# Patient Record
Sex: Female | Born: 1975 | Race: White | Hispanic: No | Marital: Married | Smoking: Never smoker
Health system: Southern US, Community
[De-identification: ages and names within clinical notes are randomized; demographics above are authoritative.]

## PROBLEM LIST (undated history)

## (undated) DIAGNOSIS — I10 Essential (primary) hypertension: Secondary | ICD-10-CM

## (undated) DIAGNOSIS — R519 Headache, unspecified: Secondary | ICD-10-CM

## (undated) DIAGNOSIS — F32A Depression, unspecified: Secondary | ICD-10-CM

## (undated) DIAGNOSIS — J069 Acute upper respiratory infection, unspecified: Secondary | ICD-10-CM

## (undated) DIAGNOSIS — L309 Dermatitis, unspecified: Secondary | ICD-10-CM

## (undated) DIAGNOSIS — T7840XA Allergy, unspecified, initial encounter: Secondary | ICD-10-CM

## (undated) HISTORY — DX: Allergy, unspecified, initial encounter: T78.40XA

## (undated) HISTORY — DX: Depression, unspecified: F32.A

## (undated) HISTORY — PX: CHOLECYSTECTOMY: SHX55

## (undated) HISTORY — PX: GALLBLADDER SURGERY: SHX652

## (undated) HISTORY — PX: EYE SURGERY: SHX253

## (undated) HISTORY — PX: ABDOMINAL HYSTERECTOMY: SHX81

## (undated) HISTORY — DX: Headache, unspecified: R51.9

## (undated) HISTORY — PX: FRACTURE SURGERY: SHX138

## (undated) HISTORY — DX: Acute upper respiratory infection, unspecified: J06.9

## (undated) HISTORY — DX: Dermatitis, unspecified: L30.9

## (undated) HISTORY — DX: Essential (primary) hypertension: I10

---

## 2017-08-04 ENCOUNTER — Ambulatory Visit (INDEPENDENT_AMBULATORY_CARE_PROVIDER_SITE_OTHER): Payer: 59 | Admitting: Allergy and Immunology

## 2017-08-04 ENCOUNTER — Encounter: Payer: Self-pay | Admitting: Allergy and Immunology

## 2017-08-04 VITALS — BP 118/70 | HR 88 | Temp 98.7°F | Resp 16 | Ht 66.5 in | Wt 278.2 lb

## 2017-08-04 DIAGNOSIS — J3089 Other allergic rhinitis: Secondary | ICD-10-CM

## 2017-08-04 DIAGNOSIS — H1010 Acute atopic conjunctivitis, unspecified eye: Secondary | ICD-10-CM | POA: Insufficient documentation

## 2017-08-04 DIAGNOSIS — H1013 Acute atopic conjunctivitis, bilateral: Secondary | ICD-10-CM | POA: Diagnosis not present

## 2017-08-04 DIAGNOSIS — L309 Dermatitis, unspecified: Secondary | ICD-10-CM

## 2017-08-04 MED ORDER — EPINEPHRINE 0.3 MG/0.3ML IJ SOAJ: 0.3000 mg | Freq: Once | INTRAMUSCULAR | 1 refills | Status: AC

## 2017-08-04 MED ORDER — TRIAMCINOLONE ACETONIDE 0.1 % EX CREA
1.0000 | TOPICAL_CREAM | Freq: Two times a day (BID) | CUTANEOUS | 5 refills | Status: DC | PRN
Start: 2017-08-04 — End: 2022-04-17

## 2017-08-04 MED ORDER — OLOPATADINE HCL 0.2 % OP SOLN: 1.0000 [drp] | Freq: Every day | OPHTHALMIC | 5 refills | Status: DC | PRN

## 2017-08-04 NOTE — Patient Instructions (Addendum)
Perennial and seasonal allergic rhinitis  Aeroallergen avoidance measures have been discussed and provided in written form.  She will restart aero allergen immunotherapy injections.  To ensure comprehensive vials, we will combine today's results with her previous test results.  Continue levocetirizine 5 mg daily as needed.  To avoid diminishing benefit with daily use (tachyphylaxis) of second generation antihistamine, consider alternating every few months between fexofenadine (Allegra) and levocetirizine (Xyzal).  Continue Dymista, 2 sprays per nostril daily as needed.  I have also recommended nasal saline spray (i.e., Simply Saline) or nasal saline lavage (i.e., NeilMed) as needed and prior to medicated nasal sprays.  Allergic conjunctivitis  Treatment plan as outlined above for allergic rhinitis.  A prescription has been provided for Pataday, one drop per eye daily as needed.  Dermatitis Unclear etiology. This does not appear to be urticaria, contact dermatitis, or atopic dermatitis. Food allergen skin tests were negative today despite a positive histamine control.  A prescription has been provided for triamcinolone 0.1% cream sparingly to affected areas twice daily as needed.  If symptoms persist or progress, dermatology evaluation with biopsy of an active lesion is recommended.   Return for immunotherapy injections.  Reducing Pollen Exposure  The American Academy of Allergy, Asthma and Immunology suggests the following steps to reduce your exposure to pollen during allergy seasons.    1. Do not hang sheets or clothing out to dry; pollen may collect on these items. 2. Do not mow lawns or spend time around freshly cut grass; mowing stirs up pollen. 3. Keep windows closed at night.  Keep car windows closed while driving. 4. Minimize morning activities outdoors, a time when pollen counts are usually at their highest. 5. Stay indoors as much as possible when pollen counts or  humidity is high and on windy days when pollen tends to remain in the air longer. 6. Use air conditioning when possible.  Many air conditioners have filters that trap the pollen spores. 7. Use a HEPA room air filter to remove pollen form the indoor air you breathe.   Control of Mold Allergen  Mold and fungi can grow on a variety of surfaces provided certain temperature and moisture conditions exist.  Outdoor molds grow on plants, decaying vegetation and soil.  The major outdoor mold, Alternaria and Cladosporium, are found in very high numbers during hot and dry conditions.  Generally, a late Summer - Fall peak is seen for common outdoor fungal spores.  Rain will temporarily lower outdoor mold spore count, but counts rise rapidly when the rainy period ends.  The most important indoor molds are Aspergillus and Penicillium.  Dark, humid and poorly ventilated basements are ideal sites for mold growth.  The next most common sites of mold growth are the bathroom and the kitchen.  Outdoor MicrosoftMold Control 1. Use air conditioning and keep windows closed 2. Avoid exposure to decaying vegetation. 3. Avoid leaf raking. 4. Avoid grain handling. 5. Consider wearing a face mask if working in moldy areas.  Indoor Mold Control 1. Maintain humidity below 50%. 2. Clean washable surfaces with 5% bleach solution. 3. Remove sources e.g. Contaminated carpets.  Control of Dog or Cat Allergen  Avoidance is the best way to manage a dog or cat allergy. If you have a dog or cat and are allergic to dog or cats, consider removing the dog or cat from the home. If you have a dog or cat but don't want to find it a new home, or if your family wants  a pet even though someone in the household is allergic, here are some strategies that may help keep symptoms at bay:  1. Keep the pet out of your bedroom and restrict it to only a few rooms. Be advised that keeping the dog or cat in only one room will not limit the allergens to that  room. 2. Don't pet, hug or kiss the dog or cat; if you do, wash your hands with soap and water. 3. High-efficiency particulate air (HEPA) cleaners run continuously in a bedroom or living room can reduce allergen levels over time. 4. Place electrostatic material sheet in the air inlet vent in the bedroom. 5. Regular use of a high-efficiency vacuum cleaner or a central vacuum can reduce allergen levels. 6. Giving your dog or cat a bath at least once a week can reduce airborne allergen.  Control of House Dust Mite Allergen  House dust mites play a major role in allergic asthma and rhinitis.  They occur in environments with high humidity wherever human skin, the food for dust mites is found. High levels have been detected in dust obtained from mattresses, pillows, carpets, upholstered furniture, bed covers, clothes and soft toys.  The principal allergen of the house dust mite is found in its feces.  A gram of dust may contain 1,000 mites and 250,000 fecal particles.  Mite antigen is easily measured in the air during house cleaning activities.    1. Encase mattresses, including the box spring, and pillow, in an air tight cover.  Seal the zipper end of the encased mattresses with wide adhesive tape. 2. Wash the bedding in water of 130 degrees Farenheit weekly.  Avoid cotton comforters/quilts and flannel bedding: the most ideal bed covering is the dacron comforter. 3. Remove all upholstered furniture from the bedroom. 4. Remove carpets, carpet padding, rugs, and non-washable window drapes from the bedroom.  Wash drapes weekly or use plastic window coverings. 5. Remove all non-washable stuffed toys from the bedroom.  Wash stuffed toys weekly. 6. Have the room cleaned frequently with a vacuum cleaner and a damp dust-mop.  The patient should not be in a room which is being cleaned and should wait 1 hour after cleaning before going into the room. 7. Close and seal all heating outlets in the bedroom.   Otherwise, the room will become filled with dust-laden air.  An electric heater can be used to heat the room. 8. Reduce indoor humidity to less than 50%.  Do not use a humidifier.

## 2017-08-04 NOTE — Assessment & Plan Note (Signed)
   Treatment plan as outlined above for allergic rhinitis.  A prescription has been provided for Pataday, one drop per eye daily as needed. 

## 2017-08-04 NOTE — Progress Notes (Signed)
New Patient Note  RE: Kimberly Valentine MRN: [id number] DOB: 31-Aug-1976 Date of Office Visit: 08/04/2017  Referring provider: Tracey HarriesGuse, Lauren M, FNP Primary care provider: Tracey HarriesGuse, Lauren M, FNP  Chief Complaint: Allergic Rhinitis  and Rash   History of present illness: Kimberly Valentine is a 41 y.o. female seen today in consultation requested by Leanora CoverLauren Guse, FNP.  [place].  She was started on aeroallergen immunotherapy in May 2014 because she was tested and found to be "allergic to everything", including grass pollen, tree pollen, molds, dog epithelia, and dust.  She had experienced nasal congestion, sinus pressure over the forehead and cheekbones, postnasal drainage, ocular pruritus, and occasional rhinorrhea and sneezing.  She reports that her nasal, sinus, and ocular symptoms have improved over the past 4 years on immunotherapy, however she still requires levocetirizine and Dymista daily for symptom control.  She discontinued both these medications 3 days ago in anticipation of today's visit and has experienced increased symptoms, particularly generalized pruritus.  She has no history of asthma.  She has a history of hand and foot dermatitis which she attempts to treat with triamcinolone ointment.  She also reports that since May she has had very small red bumps on the tops of her feet when she is wearing shoes that expose the tops of her feet to the environment. The rash will appear without sunlight exposure. The rash is not pruritic and completely resolves within 12-24 hours.  There are no hives and no vesicles.   Assessment and plan: Perennial and seasonal allergic rhinitis  Aeroallergen avoidance measures have been discussed and provided in written form.  She will restart aero allergen immunotherapy injections.  To ensure comprehensive vials, we will combine today's results with her previous test results.  Continue  levocetirizine 5 mg daily as needed.  To avoid diminishing benefit with daily use (tachyphylaxis) of second generation antihistamine, consider alternating every few months between fexofenadine (Allegra) and levocetirizine (Xyzal).  Continue Dymista, 2 sprays per nostril daily as needed.  I have also recommended nasal saline spray (i.e., Simply Saline) or nasal saline lavage (i.e., NeilMed) as needed and prior to medicated nasal sprays.  Allergic conjunctivitis  Treatment plan as outlined above for allergic rhinitis.  A prescription has been provided for Pataday, one drop per eye daily as needed.  Dermatitis Unclear etiology. This does not appear to be urticaria, contact dermatitis, or atopic dermatitis. Food allergen skin tests were negative today despite a positive histamine control.  A prescription has been provided for triamcinolone 0.1% cream sparingly to affected areas twice daily as needed.  If symptoms persist or progress, dermatology evaluation with biopsy of an active lesion is recommended.   Meds ordered this encounter  Medications  . triamcinolone cream (KENALOG) 0.1 %    Sig: Apply 1 application 2 (two) times daily as needed topically.    Dispense:  60 g    Refill:  5  . Olopatadine HCl (PATADAY) 0.2 % SOLN    Sig: Place 1 drop daily as needed into both eyes.    Dispense:  1 Bottle    Refill:  5  . EPINEPHrine (AUVI-Q) 0.3 mg/0.3 mL IJ SOAJ injection    Sig: Inject 0.3 mLs (0.3 mg total) once for 1 dose into the muscle.    Dispense:  2 Device    Refill:  1    [id number]    Diagnostics: Environmental skin testing: Positive  to grass pollen molds, dog epithelia, and dust mite antigen. Food allergen skin testing: Negative despite a positive histamine control.    Physical examination: Blood pressure 118/70, pulse 88, temperature 98.7 F (37.1 C), temperature source Oral, resp. rate 16, height 5' 6.5" (1.689 m), weight 278 lb 3.2 oz (126.2 kg), SpO2 97  %.  General: Alert, interactive, in no acute distress. HEENT: TMs pearly gray, turbinates moderately edematous with clear discharge, post-pharynx mildly erythematous. Neck: Supple without lymphadenopathy. Lungs: Clear to auscultation without wheezing, rhonchi or rales. CV: Normal S1, S2 without murmurs. Abdomen: Nondistended, nontender. Skin: non-raised erythematous maucules. Extremities:  No clubbing, cyanosis or edema on dorsa of feet bilaterally. Neuro:   Grossly intact.  Review of systems:  Review of systems negative except as noted in HPI / PMHx or noted below: Review of Systems  Constitutional: Negative.   HENT: Negative.   Eyes: Negative.   Respiratory: Negative.   Cardiovascular: Negative.   Gastrointestinal: Negative.   Genitourinary: Negative.   Musculoskeletal: Negative.   Skin: Negative.   Neurological: Negative.   Endo/Heme/Allergies: Negative.   Psychiatric/Behavioral: Negative.     Past medical history:  Past Medical History:  Diagnosis Date  . Eczema    hands and feet  . Recurrent upper respiratory infection (URI)     Past surgical history:  Past Surgical History:  Procedure Laterality Date  . ABDOMINAL HYSTERECTOMY    . GALLBLADDER SURGERY      Family history: History reviewed. No pertinent family history.  Social history: Social History   Socioeconomic History  . Marital status: Married    Spouse name: Not on file  . Number of children: Not on file  . Years of education: Not on file  . Highest education level: Not on file  Social Needs  . Financial resource strain: Not on file  . Food insecurity - worry: Not on file  . Food insecurity - inability: Not on file  . Transportation needs - medical: Not on file  . Transportation needs - non-medical: Not on file  Occupational History  . Not on file  Tobacco Use  . Smoking status: Never Smoker  . Smokeless tobacco: Never Used  Substance and Sexual Activity  . Alcohol use: No    Frequency:  Never  . Drug use: No  . Sexual activity: Not on file  Other Topics Concern  . Not on file  Social History Narrative  . Not on file   Environmental History: The patient lives in a 31-year-old house with carpeting throughout and central air/heat.  3 dogs in the home which do not have access to her bedroom.  She is a non-smoker.  There is no known mold/water damage in the home.  Allergies as of 08/04/2017      Reactions   Septra [sulfamethoxazole-trimethoprim] Hives, Shortness Of Breath      Medication List        Accurate as of 08/04/17  5:43 PM. Always use your most recent med list.          DYMISTA 137-50 MCG/ACT Susp Generic drug:  Azelastine-Fluticasone Place into the nose.   EPINEPHrine 0.3 mg/0.3 mL Soaj injection Commonly known as:  AUVI-Q Inject 0.3 mLs (0.3 mg total) once for 1 dose into the muscle.   escitalopram 10 MG tablet Commonly known as:  LEXAPRO   levocetirizine 5 MG tablet Commonly known as:  XYZAL Take 5 mg every evening by mouth.   losartan-hydrochlorothiazide 100-12.5 MG tablet Commonly known as:  HYZAAR  Olopatadine HCl 0.2 % Soln Commonly known as:  PATADAY Place 1 drop daily as needed into both eyes.   triamcinolone cream 0.1 % Commonly known as:  KENALOG Apply 1 application 2 (two) times daily as needed topically.       Known medication allergies: Allergies  Allergen Reactions  . Septra [Sulfamethoxazole-Trimethoprim] Hives and Shortness Of Breath    I appreciate the opportunity to take part in Kismet's care. Please do not hesitate to contact me with questions.  Sincerely,   R. Jorene Guestarter Fallyn Munnerlyn, MD

## 2017-08-04 NOTE — Assessment & Plan Note (Signed)
Unclear etiology. This does not appear to be urticaria, contact dermatitis, or atopic dermatitis. Food allergen skin tests were negative today despite a positive histamine control.  A prescription has been provided for triamcinolone 0.1% cream sparingly to affected areas twice daily as needed.  If symptoms persist or progress, dermatology evaluation with biopsy of an active lesion is recommended.

## 2017-08-04 NOTE — Assessment & Plan Note (Signed)
   Aeroallergen avoidance measures have been discussed and provided in written form.  She will restart aero allergen immunotherapy injections.  To ensure comprehensive vials, we will combine today's results with her previous test results.  Continue levocetirizine 5 mg daily as needed.  To avoid diminishing benefit with daily use (tachyphylaxis) of second generation antihistamine, consider alternating every few months between fexofenadine (Allegra) and levocetirizine (Xyzal).  Continue Dymista, 2 sprays per nostril daily as needed.  I have also recommended nasal saline spray (i.e., Simply Saline) or nasal saline lavage (i.e., NeilMed) as needed and prior to medicated nasal sprays.

## 2017-08-11 ENCOUNTER — Telehealth: Payer: Self-pay

## 2017-08-11 NOTE — Telephone Encounter (Signed)
Spoke with pt and informed her to call aspn

## 2017-08-11 NOTE — Telephone Encounter (Signed)
LM for pt to call us back about calling aspn about shipment of auvi-q

## 2019-01-31 LAB — HM COLONOSCOPY

## 2020-05-03 DIAGNOSIS — Z1231 Encounter for screening mammogram for malignant neoplasm of breast: Secondary | ICD-10-CM | POA: Diagnosis not present

## 2020-09-02 DIAGNOSIS — Z20828 Contact with and (suspected) exposure to other viral communicable diseases: Secondary | ICD-10-CM | POA: Diagnosis not present

## 2020-09-02 DIAGNOSIS — J040 Acute laryngitis: Secondary | ICD-10-CM | POA: Diagnosis not present

## 2020-09-20 DIAGNOSIS — F32A Depression, unspecified: Secondary | ICD-10-CM | POA: Diagnosis not present

## 2020-09-20 DIAGNOSIS — F419 Anxiety disorder, unspecified: Secondary | ICD-10-CM | POA: Diagnosis not present

## 2020-09-20 DIAGNOSIS — I10 Essential (primary) hypertension: Secondary | ICD-10-CM | POA: Diagnosis not present

## 2021-03-21 DIAGNOSIS — F32A Depression, unspecified: Secondary | ICD-10-CM | POA: Diagnosis not present

## 2021-03-21 DIAGNOSIS — R5383 Other fatigue: Secondary | ICD-10-CM | POA: Diagnosis not present

## 2021-03-21 DIAGNOSIS — Z0000 Encounter for general adult medical examination without abnormal findings: Secondary | ICD-10-CM | POA: Diagnosis not present

## 2021-03-21 DIAGNOSIS — I10 Essential (primary) hypertension: Secondary | ICD-10-CM | POA: Diagnosis not present

## 2021-03-21 DIAGNOSIS — Z0001 Encounter for general adult medical examination with abnormal findings: Secondary | ICD-10-CM | POA: Diagnosis not present

## 2021-03-21 DIAGNOSIS — Z79899 Other long term (current) drug therapy: Secondary | ICD-10-CM | POA: Diagnosis not present

## 2021-03-21 DIAGNOSIS — Z760 Encounter for issue of repeat prescription: Secondary | ICD-10-CM | POA: Diagnosis not present

## 2021-03-21 DIAGNOSIS — F419 Anxiety disorder, unspecified: Secondary | ICD-10-CM | POA: Diagnosis not present

## 2021-04-23 DIAGNOSIS — R7989 Other specified abnormal findings of blood chemistry: Secondary | ICD-10-CM | POA: Diagnosis not present

## 2021-05-07 DIAGNOSIS — U071 COVID-19: Secondary | ICD-10-CM | POA: Diagnosis not present

## 2021-05-31 DIAGNOSIS — Z1231 Encounter for screening mammogram for malignant neoplasm of breast: Secondary | ICD-10-CM | POA: Diagnosis not present

## 2021-06-19 DIAGNOSIS — R7989 Other specified abnormal findings of blood chemistry: Secondary | ICD-10-CM | POA: Diagnosis not present

## 2021-08-13 DIAGNOSIS — J069 Acute upper respiratory infection, unspecified: Secondary | ICD-10-CM | POA: Diagnosis not present

## 2021-09-02 DIAGNOSIS — R051 Acute cough: Secondary | ICD-10-CM | POA: Diagnosis not present

## 2021-09-02 DIAGNOSIS — J019 Acute sinusitis, unspecified: Secondary | ICD-10-CM | POA: Diagnosis not present

## 2021-10-15 DIAGNOSIS — R7309 Other abnormal glucose: Secondary | ICD-10-CM | POA: Diagnosis not present

## 2021-10-15 DIAGNOSIS — K219 Gastro-esophageal reflux disease without esophagitis: Secondary | ICD-10-CM | POA: Diagnosis not present

## 2021-10-15 DIAGNOSIS — E559 Vitamin D deficiency, unspecified: Secondary | ICD-10-CM | POA: Diagnosis not present

## 2021-10-15 DIAGNOSIS — I10 Essential (primary) hypertension: Secondary | ICD-10-CM | POA: Diagnosis not present

## 2021-10-15 DIAGNOSIS — E782 Mixed hyperlipidemia: Secondary | ICD-10-CM | POA: Diagnosis not present

## 2021-10-15 DIAGNOSIS — R5383 Other fatigue: Secondary | ICD-10-CM | POA: Diagnosis not present

## 2021-11-14 DIAGNOSIS — E538 Deficiency of other specified B group vitamins: Secondary | ICD-10-CM | POA: Diagnosis not present

## 2021-12-15 ENCOUNTER — Emergency Department (HOSPITAL_BASED_OUTPATIENT_CLINIC_OR_DEPARTMENT_OTHER): Payer: BC Managed Care – PPO

## 2021-12-15 ENCOUNTER — Other Ambulatory Visit: Payer: Self-pay

## 2021-12-15 ENCOUNTER — Encounter (HOSPITAL_BASED_OUTPATIENT_CLINIC_OR_DEPARTMENT_OTHER): Payer: Self-pay | Admitting: Emergency Medicine

## 2021-12-15 ENCOUNTER — Emergency Department (HOSPITAL_BASED_OUTPATIENT_CLINIC_OR_DEPARTMENT_OTHER)
Admission: EM | Admit: 2021-12-15 | Discharge: 2021-12-15 | Disposition: A | Discharge status: Home / Self Care | Payer: BC Managed Care – PPO | Attending: Emergency Medicine | Admitting: Emergency Medicine

## 2021-12-15 DIAGNOSIS — S82832A Other fracture of upper and lower end of left fibula, initial encounter for closed fracture: Secondary | ICD-10-CM | POA: Diagnosis not present

## 2021-12-15 DIAGNOSIS — S82831A Other fracture of upper and lower end of right fibula, initial encounter for closed fracture: Secondary | ICD-10-CM | POA: Diagnosis not present

## 2021-12-15 DIAGNOSIS — M25571 Pain in right ankle and joints of right foot: Secondary | ICD-10-CM | POA: Diagnosis not present

## 2021-12-15 DIAGNOSIS — Y9289 Other specified places as the place of occurrence of the external cause: Secondary | ICD-10-CM | POA: Insufficient documentation

## 2021-12-15 DIAGNOSIS — S99911A Unspecified injury of right ankle, initial encounter: Secondary | ICD-10-CM | POA: Diagnosis not present

## 2021-12-15 DIAGNOSIS — M25572 Pain in left ankle and joints of left foot: Secondary | ICD-10-CM | POA: Insufficient documentation

## 2021-12-15 DIAGNOSIS — S89301A Unspecified physeal fracture of lower end of right fibula, initial encounter for closed fracture: Secondary | ICD-10-CM | POA: Diagnosis not present

## 2021-12-15 DIAGNOSIS — M7989 Other specified soft tissue disorders: Secondary | ICD-10-CM | POA: Diagnosis not present

## 2021-12-15 DIAGNOSIS — W010XXA Fall on same level from slipping, tripping and stumbling without subsequent striking against object, initial encounter: Secondary | ICD-10-CM | POA: Insufficient documentation

## 2021-12-15 MED ORDER — OXYCODONE-ACETAMINOPHEN 5-325 MG PO TABS
1.0000 | ORAL_TABLET | Freq: Once | ORAL | Status: AC
  Administered 2021-12-15: 1 via ORAL
  Filled 2021-12-15: qty 1

## 2021-12-15 MED ORDER — OXYCODONE-ACETAMINOPHEN 5-325 MG PO TABS: 1.0000 | ORAL_TABLET | Freq: Four times a day (QID) | ORAL | 0 refills | Status: DC | PRN

## 2021-12-15 NOTE — ED Triage Notes (Signed)
Pt arrives pov, to triage in wheelchair, c/o bilateral ankle pain after mechanical fall today. Deneis loc, denies dizziness, denies thinners. Pain with ambulation, decreased ROM. Swelling noted to lateral ankles, bilaterally ?

## 2021-12-15 NOTE — ED Provider Notes (Signed)
?[facility]ENC[facility] DEPART[facility] ?Provider Note ? ? ?CSN: [id number] ?Arrival date & time: 12/15/21  1728 ? ?  ? ?History ? ?Chief Complaint  ?Patient presents with  ? Fall  ? ? ?Kimberly Valentine is a 46 y.o. female who presents to the ED today with complaint of sudden onset, constant, sharp, bilateral ankle pain status post mechanical fall that occurred earlier today.  Patient reports that she was at her father's funeral today when she tripped and fell.  She states that both of her legs went underneath her and she heard Avril pops, unsure which ankle it was coming from.  Denies head injury or loss of consciousness.  She has been limping due to pain since that time.  Reports right ankle is more painful than left.  Not taking anything specifically for the pain.  Reports pain worsened after taking off her she is here to have x-ray done.  ? ?The history is provided by the patient and medical records.  ? ?  ? ?Home Medications ?Prior to Admission medications   ?Medication Sig Start Date End Date Taking? Authorizing Provider  ?oxyCODONE-acetaminophen (PERCOCET/ROXICET) 5-325 MG tablet Take 1 tablet by mouth every 6 (six) hours as needed for severe pain. 12/15/21  Yes Eustaquio Maize, PA-C  ?Azelastine-Fluticasone (DYMISTA) 137-50 MCG/ACT SUSP Place into the nose.    [provider]  ?escitalopram (LEXAPRO) 10 MG tablet  05/14/17   [provider]  ?levocetirizine (XYZAL) 5 MG tablet Take 5 mg every evening by mouth.    [provider]  ?losartan-hydrochlorothiazide Konrad Penta) 100-12.5 MG tablet  06/25/17   [provider]  ?Olopatadine HCl (PATADAY) 0.2 % SOLN Place 1 drop daily as needed into both eyes. 08/04/17   Bobbitt, Sedalia Muta, MD  ?triamcinolone cream (KENALOG) 0.1 % Apply 1 application 2 (two) times daily as needed topically. 08/04/17   Bobbitt, Sedalia Muta, MD  ?   ? ?Allergies    ?Septra [sulfamethoxazole-trimethoprim]   ? ?Review of Systems   ?Review of Systems   ?Constitutional:  Negative for chills and fever.  ?Musculoskeletal:  Positive for arthralgias and joint swelling.  ?Skin:  Negative for wound.  ?Neurological:  Negative for syncope and headaches.  ?All other systems reviewed and are negative. ? ?Physical Exam ?Updated Vital Signs ?BP 124/77 (BP Location: Right Arm)   Pulse 83   Temp 98.5 ?F (36.9 ?C) (Oral)   Resp 18   Ht 5\' 7"  (1.702 m)   Wt 120.2 kg   SpO2 94%   BMI 41.50 kg/m?  ?Physical Exam ?Vitals and nursing note reviewed.  ?Constitutional:   ?   Appearance: She is obese. She is not ill-appearing.  ?HENT:  ?   Head: Normocephalic and atraumatic.  ?Eyes:  ?   Conjunctiva/sclera: Conjunctivae normal.  ?Cardiovascular:  ?   Rate and Rhythm: Normal rate and regular rhythm.  ?Pulmonary:  ?   Effort: Pulmonary effort is normal.  ?   Breath sounds: Normal breath sounds.  ?Musculoskeletal:  ?   Comments: Moderate swelling noted to bilateral ankles.  Mild tenderness palpation along the left ankle lateral malleolus.  Range of motion intact to left ankle with dorsiflexion and plantarflexion.  2+ DP pulse.  Cap refill less than 2 seconds on left toes. ? ?Positive significant tenderness palpation along the lateral malleolus of the right ankle with limited range of motion secondary to pain however able to dorsiflex and plantarflex slowly.  No tenderness palpation proximally or distally.  2+ DP pulse.  Able to wiggle toes without difficulty.  Cap refill less than 2 seconds on right toes.  ?Skin: ?   General: Skin is warm and dry.  ?   Coloration: Skin is not jaundiced.  ?Neurological:  ?   Mental Status: She is alert.  ? ? ?ED Results / Procedures / Treatments   ?Labs ?(all labs ordered are listed, but only abnormal results are displayed) ?Labs Reviewed - No data to display ? ?EKG ?None ? ?Radiology ?DG Ankle Complete Left ? ?Result Date: 12/15/2021 ?CLINICAL DATA:  Acute LEFT ankle pain following fall today. Initial encounter. EXAM: LEFT ANKLE COMPLETE - 3+ VIEW  COMPARISON:  None. FINDINGS: There is no evidence of acute fracture, subluxation or dislocation. Mild LATERAL soft tissue swelling is noted. No focal bony lesions are present. IMPRESSION: Mild LATERAL soft tissue swelling. No acute bony abnormality. Electronically Signed   By: Margarette Canada M.D.   On: 12/15/2021 18:33  ? ?DG Ankle Complete Right ? ?Result Date: 12/15/2021 ?CLINICAL DATA:  Acute RIGHT ankle pain following fall today. Initial encounter. EXAM: RIGHT ANKLE - COMPLETE 3+ VIEW COMPARISON:  None. FINDINGS: An oblique fracture of the distal fibula is noted, with 1.5 mm LATERAL displacement. There is slight widening of the MEDIAL tibiotalar joint. No dislocation identified. LATERAL soft tissue swelling is present. IMPRESSION: Oblique fracture of the distal fibula with 1.5 mm LATERAL displacement and slight widening of the MEDIAL tibiotalar joint. Electronically Signed   By: Margarette Canada M.D.   On: 12/15/2021 18:30   ? ?Procedures ?Procedures  ? ? ?Medications Ordered in ED ?Medications  ?oxyCODONE-acetaminophen (PERCOCET/ROXICET) 5-325 MG per tablet 1 tablet (1 tablet Oral Given 12/15/21 2015)  ? ? ?ED Course/ Medical Decision Making/ A&P ?Clinical Course as of 12/15/21 2019  ?Sun Dec 15, 2021  ?1927 8:30 AM tomorrow morning.  [MV]  ?  ?Clinical Course User Index ?[MV] Eustaquio Maize, PA-C  ? ?                        ?Medical Decision Making ?46 year old female who presents to the ED today status post mechanical fall, currently complaining of bilateral ankle pain, right greater than left.  On arrival to the ED vitals are stable.  He had x-rays done of her bilateral ankles prior to being seen.  Left ankle with soft tissue swelling along lateral malleolus, no acute fractures.  X-ray of the right ankle does show an oblique fracture of the distal fibula with a 1.5 mm lateral displacement and slight widening of the medial tibiotalar joint.  Given widening with concern for unstable oint we will plan to consult Ortho  for further recommendations.  SPECT splinting and outpatient follow-up.  Patient unsure how she will do on crutches however will assess to see if she can put any weight on her left ankle crutches.  She does endorse that they have a wheelchair at home that she could use as needed, feel this this would be reasonable if she fails crutches.  ? ?She did not do very well with crutches.  Will discharge home with crutches to use as needed however she does have a wheelchair at home.  Patient discharged with pain medication with plans to see Dr. Percell Miller tomorrow morning at 8:30 AM.  She is in agreement plan at this time and stable for discharge. Ace wrap to left ankle.  ? ?Problems Addressed: ?Acute left ankle pain: acute illness or injury ?Closed fracture of distal end of right  fibula, unspecified fracture morphology, initial encounter: acute illness or injury ? ?Amount and/or Complexity of Data Reviewed ?Radiology: ordered. ?Discussion of management or test interpretation with external provider(s): Discussed case with orthopedist Dr. Percell Miller who recommends short leg posterior splint and stirrup.  Recommend seeing her in the office at 8:30 AM tomorrow morning  ? ?Risk ?Prescription drug management. ? ? ? ? ? ? ? ? ? ?Final Clinical Impression(s) / ED Diagnoses ?Final diagnoses:  ?Closed fracture of distal end of right fibula, unspecified fracture morphology, initial encounter  ?Acute left ankle pain  ? ? ?Rx / DC Orders ?ED Discharge Orders   ? ?      Ordered  ?  oxyCODONE-acetaminophen (PERCOCET/ROXICET) 5-325 MG tablet  Every 6 hours PRN       ? 12/15/21 2017  ? ?  ?  ? ?  ? ? ? ?Discharge Instructions   ? ?  ?Please follow-up with Dr. Percell Miller as scheduled at 8:30 AM tomorrow morning for your fracture. ? ?Pick up with pain medication to take as needed.  It is recommended that you do not bear any weight on this ankle until he can be seen.  Use the wheelchair that you have at home as needed and switch to the crutches if you  feel more comfortable on your left ankle. ? ?While at home please rest, ice, and elevate your ankle to help with inflammation/swelling ? ?Return to the ED for any new/worsening symptoms ? ? ? ? ?  ?Eustaquio Maize, PA-C ?03/1

## 2021-12-15 NOTE — Discharge Instructions (Addendum)
Please follow-up with Dr. Eulah Pont as scheduled at 8:30 AM tomorrow morning for your fracture. ? ?Pick up with pain medication to take as needed.  It is recommended that you do not bear any weight on this ankle until he can be seen.  Use the wheelchair that you have at home as needed and switch to the crutches if you feel more comfortable on your left ankle. ? ?While at home please rest, ice, and elevate your ankle to help with inflammation/swelling ? ?Return to the ED for any new/worsening symptoms ?

## 2021-12-16 DIAGNOSIS — S82831A Other fracture of upper and lower end of right fibula, initial encounter for closed fracture: Secondary | ICD-10-CM | POA: Diagnosis not present

## 2021-12-19 DIAGNOSIS — G8918 Other acute postprocedural pain: Secondary | ICD-10-CM | POA: Diagnosis not present

## 2021-12-19 DIAGNOSIS — S93431A Sprain of tibiofibular ligament of right ankle, initial encounter: Secondary | ICD-10-CM | POA: Diagnosis not present

## 2021-12-19 DIAGNOSIS — S82841A Displaced bimalleolar fracture of right lower leg, initial encounter for closed fracture: Secondary | ICD-10-CM | POA: Diagnosis not present

## 2021-12-19 DIAGNOSIS — Y999 Unspecified external cause status: Secondary | ICD-10-CM | POA: Diagnosis not present

## 2021-12-30 DIAGNOSIS — S82841D Displaced bimalleolar fracture of right lower leg, subsequent encounter for closed fracture with routine healing: Secondary | ICD-10-CM | POA: Diagnosis not present

## 2022-01-14 DIAGNOSIS — H5213 Myopia, bilateral: Secondary | ICD-10-CM | POA: Diagnosis not present

## 2022-01-29 DIAGNOSIS — S82841D Displaced bimalleolar fracture of right lower leg, subsequent encounter for closed fracture with routine healing: Secondary | ICD-10-CM | POA: Diagnosis not present

## 2022-02-07 NOTE — Therapy (Signed)
?OUTPATIENT PHYSICAL THERAPY LOWER EXTREMITY EVALUATION ? ? ?Patient Name: Kimberly Valentine ?MRN: [id number] ?DOB:04/01/1976, 46 y.o., female ?Today's Date: 02/12/2022 ? ? PT End of Session - 02/12/22 0851   ? ? Visit Number 1   ? Number of Visits 12   ? Date for PT Re-Evaluation 03/26/22   ? Authorization Type BCBS   ? PT Start Time [phone]   ? PT Stop Time 0935   ? PT Time Calculation (min) 45 min   ? Activity Tolerance Patient tolerated treatment well   ? Behavior During Therapy [facility] for tasks assessed/performed   ? ?  ?  ? ?  ? ? ?Past Medical History:  ?Diagnosis Date  ? Eczema   ? hands and feet  ? Recurrent upper respiratory infection (URI)   ? ?Past Surgical History:  ?Procedure Laterality Date  ? ABDOMINAL HYSTERECTOMY    ? GALLBLADDER SURGERY    ? ?Patient Active Problem List  ? Diagnosis Date Noted  ? Perennial and seasonal allergic rhinitis 08/04/2017  ? Allergic conjunctivitis 08/04/2017  ? Dermatitis 08/04/2017  ? ? ?PCP: Tracey Harries, FNP ? ?REFERRING PROVIDER: Sheral Apley, MD ? ?REFERRING DIAG: J28.786V (ICD-10-CM) - Ankle fracture ? ?THERAPY DIAG:  ?Stiffness of right ankle, not elsewhere classified ? ?Pain in right ankle and joints of right foot ? ?Other abnormalities of gait and mobility ? ?Muscle weakness (generalized) ? ?Stiffness of left ankle, not elsewhere classified ? ?ONSET DATE: 12/15/2021 ? ?SUBJECTIVE:  ? ?SUBJECTIVE STATEMENT: ?Pt. Reported she fell on 12/15/2021 - she was carrying granddaughter over uneven ground at funeral, she thought she broke both ankles at first.  She did fracture her R ankle and had ORIF on 12/19/21, her L ankle she sprained and was in a boot for several weeks.  She was NWB on R for 4 weeks, and switched to CAM boot and crutches 01/29/2022.  She reports she is WBAT and told "PT would tell her when she could stop using crutches."  She is not having much ankle pain, just pressure from swelling.   ? ?PERTINENT HISTORY: ?History HTN, abdominal surgery and hysterectomy.    ?ORIF R ankle 12/19/21.  ? ?PAIN:  ?Are you having pain? Yes: NPRS scale: 1/10 ?Pain location: R ankle ?Pain description: pressure ?Aggravating factors: dependent position gets swelling ?Relieving factors: compression socks ? ?PRECAUTIONS: None ? ?WEIGHT BEARING RESTRICTIONS  WBAT  ? ?FALLS:  ?Has patient fallen in last 6 months? Yes. Number of falls 1, uneven ground ? ?LIVING ENVIRONMENT: ?Lives with: lives with their spouse ?Lives in: House/apartment ?Stairs: Yes: Internal: 10 steps; can reach both ?Has following equipment at home: Crutches ? ?OCCUPATION: work from home, payroll ? ?PLOF: Independent ? ?PATIENT GOALS get rid of crutches and drive again ? ? ?OBJECTIVE:  ? ?DIAGNOSTIC FINDINGS: 12/15/21- X-ray L ankle IMPRESSION: ?Mild LATERAL soft tissue swelling. No acute bony abnormality. ?R ankle IMPRESSION: ?Oblique fracture of the distal fibula with 1.5 mm LATERAL ?displacement and slight widening of the MEDIAL tibiotalar joint. ? ?PATIENT SURVEYS:  ?FOTO ankle 31%, predicted outcome 60% after 16 visits ? ?COGNITION: ? Overall cognitive status: Within functional limits for tasks assessed   ?  ?SENSATION: ?WFL ? ?POSTURE:  ?Slight forward head posture.  ? ?PALPATION: ?No tenderness in either ankle with palpation.  Noted swelling over L lateral malleolus but no tenderness.  Tightness R ankle, well healing incision over R lateral malleolus, good scar mobility no tenderness.   ? ?LE ROM: ? ?Active ROM Right ?02/12/2022 Left ?  02/12/2022  ?Ankle dorsiflexion -10 0  ?Ankle plantarflexion 50 50  ?Ankle inversion 25 36  ?Ankle eversion 20 30  ? (Blank rows = not tested) ? ?LE MMT: ? ?MMT Right ?02/12/2022 Left ?02/12/2022  ?Hip flexion 5 5  ?Hip extension 5 5  ?Hip abduction 5 5  ?Hip adduction 5 5  ?Knee flexion 5 5  ?Knee extension 5 5  ?Ankle dorsiflexion 4 4+  ?Ankle plantarflexion 4 4+  ?Ankle inversion 4 4+  ?Ankle eversion 4 4+  ? (Blank rows = not tested) ? ?GAIT: ?Distance walked: 90  ?Assistive device utilized:   CAM boot ?Level of assistance: SBA ?Comments: reports no pain with gait without crutches.  No signs of instability. ? ? ? ?TODAY'S TREATMENT: ?Therapeutic Exercise: to improve strength and mobility.  Demo, verbal and tactile cues throughout for technique.  Issued YTB, return demo on L ankle today, to perform with both ankles.  ?-Seated Heel Raise  - 10 reps ?- Seated Heel Toe Raises- 10 reps ?- Dick Sitting Ankle Eversion with Resistance  10 reps YTB ?- [place] Sitting Ankle Plantar Flexion with Resistance   10 reps YTB ?- Dyment Sitting Ankle Inversion with Resistance  10 reps YTB ?- [place] Sitting Ankle Dorsiflexion with Anchored Resistance  - 1 x daily 10 reps - YTB ?- Seated Ankle Alphabet  demo ?- Seated Ankle Circles  -demo ?- Seated Ankle Pumps  demo ?- Seated Calf Towel Stretch  15 sec hold ? ? ?PATIENT EDUCATION:  ?Education details: findings, POC, initial HEP, recommendations to open boot when sitting quietly and perform frequent ankle pumps and circles, continue walking in CAM walker but safe to ambulate around home without crutches.   Issued YTB.  ?Person educated: Patient ?Education method: Explanation, Demonstration, Verbal cues, and Handouts ?Education comprehension: verbalized understanding and returned demonstration ? ? ?HOME EXERCISE PROGRAM: ?Access Code: [email or web address] ? ? ?ASSESSMENT: ? ?CLINICAL IMPRESSION: ?Patient is a 46 y.o. female who was seen today for physical therapy evaluation and treatment for R ankle fracture on 12/15/2021, s/p R ankle ORIF on 12/19/21.  She also sprained her L ankle on 12/15/21.  She is healing well and reports minimal R ankle pain, but reports swelling in dependent position.  She demonstrates decreased R ankle ROM compared to L, decreased ankle strength, and impaired gait and mobility.  Educated in initial HEP to improve mobility and strength, tolerated well with no pain.  Noted today entering using crutches more for stability, based on R ankle strength (4/5) and no pain with WB,  trialed gait without crutches.  She had no pain and no instability, so will work on walking without crutches at home.  She will benefit from skilled physical therapy to improve bil ankle strength, mobility, proprioception to return to PLOF and decrease risk of continued ankle sprain.  ? ? ?OBJECTIVE IMPAIRMENTS Abnormal gait, decreased activity tolerance, decreased endurance, decreased mobility, difficulty walking, decreased ROM, decreased strength, increased edema, increased fascial restrictions, impaired perceived functional ability, increased muscle spasms, impaired flexibility, and pain.  ? ?ACTIVITY LIMITATIONS cleaning, community activity, driving, laundry, yard work, and shopping.  ? ?PERSONAL FACTORS 1 comorbidity: history L ankle sprain  are also affecting patient's functional outcome.  ? ? ?REHAB POTENTIAL: Excellent ? ?CLINICAL DECISION MAKING: Stable/uncomplicated ? ?EVALUATION COMPLEXITY: Low ? ? ?GOALS: ?Goals reviewed with patient? Yes ? ?SHORT TERM GOALS: Target date: 02/26/2022 ? ?Patient will be independent with initial HEP. ?Baseline: given ?Goal status: INITIAL ? ?2.  Pt will be able to  ambulate safely without crutches.  ?Baseline: using crutches and CAM boot ?Goal status: INITIAL ? ? ?[place] TERM GOALS: Target date: 03/26/2022  ?Patient will be independent with advanced/ongoing HEP to improve outcomes and carryover.  ?Baseline: update as needed.  ?Goal status: INITIAL ? ?2.  Patient will report no ankle/foot pain with weight bearing and ambulation.   ?Baseline: 1/10 R ankle ?Goal status: INITIAL ? ?3.  Patient will demonstrate improved R ankle AROM to = L ankle AROM  to allow for normal gait and stair mechanics. ?Baseline: see above ?Goal status: INITIAL ? ?4.  Patient will demonstrate improved functional LE strength as demonstrated by 5/5 bil ankle strength. ?Baseline: 4/5 R ankle, 4+/5 L ankle strength ?Goal status: INITIAL ? ?5.  Patient will be able to ambulate 600' with LRAD and normal gait  pattern without increased foot/ankle pain to access community.  ?Baseline: 62' without crutches today ?Goal status: INITIAL ? ?6. Patient will be able to ascend/descend stairs with 1 HR and reciprocal step pat

## 2022-02-12 ENCOUNTER — Ambulatory Visit: Payer: BC Managed Care – PPO | Attending: Orthopedic Surgery | Admitting: Physical Therapy

## 2022-02-12 ENCOUNTER — Encounter: Payer: Self-pay | Admitting: Physical Therapy

## 2022-02-12 DIAGNOSIS — R2689 Other abnormalities of gait and mobility: Secondary | ICD-10-CM | POA: Diagnosis not present

## 2022-02-12 DIAGNOSIS — M25671 Stiffness of right ankle, not elsewhere classified: Secondary | ICD-10-CM

## 2022-02-12 DIAGNOSIS — M25571 Pain in right ankle and joints of right foot: Secondary | ICD-10-CM | POA: Diagnosis not present

## 2022-02-12 DIAGNOSIS — M6281 Muscle weakness (generalized): Secondary | ICD-10-CM | POA: Diagnosis not present

## 2022-02-12 DIAGNOSIS — M25672 Stiffness of left ankle, not elsewhere classified: Secondary | ICD-10-CM

## 2022-02-18 ENCOUNTER — Ambulatory Visit: Payer: BC Managed Care – PPO

## 2022-02-18 DIAGNOSIS — R2689 Other abnormalities of gait and mobility: Secondary | ICD-10-CM

## 2022-02-18 DIAGNOSIS — M25672 Stiffness of left ankle, not elsewhere classified: Secondary | ICD-10-CM | POA: Diagnosis not present

## 2022-02-18 DIAGNOSIS — M6281 Muscle weakness (generalized): Secondary | ICD-10-CM

## 2022-02-18 DIAGNOSIS — M25671 Stiffness of right ankle, not elsewhere classified: Secondary | ICD-10-CM

## 2022-02-18 DIAGNOSIS — M25571 Pain in right ankle and joints of right foot: Secondary | ICD-10-CM | POA: Diagnosis not present

## 2022-02-18 NOTE — Therapy (Signed)
OUTPATIENT PHYSICAL THERAPY TREATMENT  Rationale for Evaluation and Treatment Rehabilitation  Patient Name: Kimberly Valentine MRN: [id number] DOB:[birth year], 46 y.o., female Today's Date: 02/18/2022   PT End of Session - 02/18/22 1755     Visit Number 2    Number of Visits 12    Date for PT Re-Evaluation 03/26/22    Authorization Type BCBS    PT Start Time 1703    PT Stop Time 1745    PT Time Calculation (min) 42 min    Activity Tolerance Patient tolerated treatment well    Behavior During Therapy WFL for tasks assessed/performed              Past Medical History:  Diagnosis Date   Eczema    hands and feet   Recurrent upper respiratory infection (URI)    Past Surgical History:  Procedure Laterality Date   ABDOMINAL HYSTERECTOMY     GALLBLADDER SURGERY     Patient Active Problem List   Diagnosis Date Noted   Perennial and seasonal allergic rhinitis 08/04/2017   Allergic conjunctivitis 08/04/2017   Dermatitis 08/04/2017    PCP: Tracey HarriesGuse, Lauren M, FNP  REFERRING PROVIDER: Sheral ApleyMurphy, Timothy D, MD  REFERRING DIAG: S82.899A (ICD-10-CM) - Ankle fracture  THERAPY DIAG:  Stiffness of right ankle, not elsewhere classified  Pain in right ankle and joints of right foot  Other abnormalities of gait and mobility  Muscle weakness (generalized)  Stiffness of left ankle, not elsewhere classified  ONSET DATE: 12/15/2021  SUBJECTIVE:   SUBJECTIVE STATEMENT: Pt reports weaning from crutches recently, still unsure whether she needs to keep using CAM boot.  PERTINENT HISTORY: History HTN, abdominal surgery and hysterectomy.   ORIF R ankle 12/19/21.   PAIN:  Are you having pain? Yes: NPRS scale: 6/10 Pain location: R dorsum of the foot Pain description: pressure Aggravating factors: dependent position gets swelling Relieving factors: compression socks  PRECAUTIONS: None  WEIGHT BEARING RESTRICTIONS  WBAT   FALLS:  Has patient fallen in last 6 months? Yes. Number of  falls 1, uneven ground  LIVING ENVIRONMENT: Lives with: lives with their spouse Lives in: House/apartment Stairs: Yes: Internal: 10 steps; can reach both Has following equipment at home: Crutches  OCCUPATION: work from home, payroll  PLOF: Independent  PATIENT GOALS get rid of crutches and drive again   OBJECTIVE:   DIAGNOSTIC FINDINGS: 12/15/21- X-ray L ankle IMPRESSION: Mild LATERAL soft tissue swelling. No acute bony abnormality. R ankle IMPRESSION: Oblique fracture of the distal fibula with 1.5 mm LATERAL displacement and slight widening of the MEDIAL tibiotalar joint.  PATIENT SURVEYS:  FOTO ankle 31%, predicted outcome 60% after 16 visits  COGNITION:  Overall cognitive status: Within functional limits for tasks assessed     SENSATION: WFL  POSTURE:  Slight forward head posture.   PALPATION: No tenderness in either ankle with palpation.  Noted swelling over L lateral malleolus but no tenderness.  Tightness R ankle, well healing incision over R lateral malleolus, good scar mobility no tenderness.    LE ROM:  Active ROM Right 02/12/2022 Left 02/12/2022  Ankle dorsiflexion -10 0  Ankle plantarflexion 50 50  Ankle inversion 25 36  Ankle eversion 20 30   (Blank rows = not tested)  LE MMT:  MMT Right 02/12/2022 Left 02/12/2022  Hip flexion 5 5  Hip extension 5 5  Hip abduction 5 5  Hip adduction 5 5  Knee flexion 5 5  Knee extension 5 5  Ankle dorsiflexion 4 4+  Ankle  plantarflexion 4 4+  Ankle inversion 4 4+  Ankle eversion 4 4+   (Blank rows = not tested)  GAIT: Distance walked: 90  Assistive device utilized:  CAM boot Level of assistance: SBA Comments: reports no pain with gait without crutches.  No signs of instability.    TODAY'S TREATMENT: 02/18/22 Therapeutic Exercise: Nu Step L4x33min Education on scar massage with demo Aps and ankle circles x 10 R gastroc stretch with towel 2 x 30 R PF/EV/IV with red TB x 10 each R toe curls with red  TB x 10  Gait Training: Stairs 4 trials, 13 steps, 7' with CAM boot !70 ft with CAM boot  Therapeutic Exercise: to improve strength and mobility.  Demo, verbal and tactile cues throughout for technique.  Issued YTB, return demo on L ankle today, to perform with both ankles.  -Seated Heel Raise  - 10 reps - Seated Heel Toe Raises- 10 reps - [place]y Sitting Ankle Eversion with Resistance  10 reps YTB - Jurgensen Sitting Ankle Plantar Flexion with Resistance   10 reps YTB - Gum Sitting Ankle Inversion with Resistance  10 reps YTB - [place]ivre Sitting Ankle Dorsiflexion with Anchored Resistance  - 1 x daily 10 reps - YTB - Seated Ankle Alphabet  demo - Seated Ankle Circles  -demo - Seated Ankle Pumps  demo - Seated Calf Towel Stretch  15 sec hold   PATIENT EDUCATION:  Education details: edu on icing and elevation to control swelling, doing ROM exercises with feet elevated, and scar massage to reduce scar tissue Person educated: Patient Education method: Medical illustrator Education comprehension: verbalized understanding and returned demonstration   HOME EXERCISE PROGRAM: Access Code: [email or web address]   ASSESSMENT:  CLINICAL IMPRESSION: Pt showed a good demonstration of her HEP today. We progressed the TB ankle exercises to red TB today, provided instructions throughout interventions for correct form for max benefit. Provided much education on ice and elevation, edema control, and scar tissue mobilization to enhance the healing process. Reviewed gait and stairs to ensure safety with CAM boot at home. Good response to initial treatment.   OBJECTIVE IMPAIRMENTS Abnormal gait, decreased activity tolerance, decreased endurance, decreased mobility, difficulty walking, decreased ROM, decreased strength, increased edema, increased fascial restrictions, impaired perceived functional ability, increased muscle spasms, impaired flexibility, and pain.   ACTIVITY LIMITATIONS cleaning, community activity,  driving, laundry, yard work, and shopping.   PERSONAL FACTORS 1 comorbidity: history L ankle sprain  are also affecting patient's functional outcome.    REHAB POTENTIAL: Excellent  CLINICAL DECISION MAKING: Stable/uncomplicated  EVALUATION COMPLEXITY: Low   GOALS: Goals reviewed with patient? Yes  SHORT TERM GOALS: Target date: 02/26/2022  Patient will be independent with initial HEP. Baseline: given Goal status: IN PROGRESS  2.  Pt will be able to ambulate safely without crutches.  Baseline: using crutches and CAM boot Goal status: IN PROGRESS   [place] TERM GOALS: Target date: 03/26/2022  Patient will be independent with advanced/ongoing HEP to improve outcomes and carryover.  Baseline: update as needed.  Goal status: IN PROGRESS  2.  Patient will report no ankle/foot pain with weight bearing and ambulation.   Baseline: 1/10 R ankle Goal status: IN PROGRESS  3.  Patient will demonstrate improved R ankle AROM to = L ankle AROM  to allow for normal gait and stair mechanics. Baseline: see above Goal status: IN PROGRESS  4.  Patient will demonstrate improved functional LE strength as demonstrated by 5/5 bil ankle strength. Baseline: 4/5 R ankle,  4+/5 L ankle strength Goal status: IN PROGRESS  5.  Patient will be able to ambulate 600' with LRAD and normal gait pattern without increased foot/ankle pain to access community.  Baseline: 55' without crutches today Goal status: IN PROGRESS  6. Patient will be able to ascend/descend stairs with 1 HR and reciprocal step pattern safely to access home and community.  Baseline: step to gait Goal status: IN PROGRESS  7.  Patient will report 60% on FOTO to demonstrate improved functional ability. Baseline: 31% Goal status: IN PROGRESS  8.  Patient will demonstrate at least 22/30 on FGA to decrease risk of falls. Baseline: not tested today Goal status: IN PROGRESS    PLAN: PT FREQUENCY: 2x/week  PT DURATION: 6  weeks  PLANNED INTERVENTIONS: Therapeutic exercises, Therapeutic activity, Neuromuscular re-education, Balance training, Gait training, Patient/Family education, Joint mobilization, Stair training, Dry Needling, Electrical stimulation, Cryotherapy, Moist heat, Vasopneumatic device, Ultrasound, Ionotophoresis 4mg /ml Dexamethasone, and Manual therapy  PLAN FOR NEXT SESSION: review HEP, progress ankle strengthening as tolerated.     , PTA 02/18/2022, 6:07 PM

## 2022-02-24 NOTE — Therapy (Incomplete)
OUTPATIENT PHYSICAL THERAPY TREATMENT  Rationale for Evaluation and Treatment Rehabilitation  Patient Name: Kimberly Valentine MRN: [id number] DOB:01-02-1976, 46 y.o., female Today's Date: 02/24/2022  Rationale for Evaluation and Treatment: Rehabilitation    Past Medical History:  Diagnosis Date   Eczema    hands and feet   Recurrent upper respiratory infection (URI)    Past Surgical History:  Procedure Laterality Date   ABDOMINAL HYSTERECTOMY     GALLBLADDER SURGERY     Patient Active Problem List   Diagnosis Date Noted   Perennial and seasonal allergic rhinitis 08/04/2017   Allergic conjunctivitis 08/04/2017   Dermatitis 08/04/2017    PCP: Tracey Harries, FNP  REFERRING PROVIDER: Sheral Apley, MD  REFERRING DIAG: S82.899A (ICD-10-CM) - Ankle fracture  THERAPY DIAG:  No diagnosis found.  ONSET DATE: 12/15/2021  SUBJECTIVE:   SUBJECTIVE STATEMENT: ***  PERTINENT HISTORY: History HTN, abdominal surgery and hysterectomy.   ORIF R ankle 12/19/21.   PAIN:  Are you having pain?***  Yes: NPRS scale: 6/10 Pain location: R dorsum of the foot Pain description: pressure Aggravating factors: dependent position gets swelling Relieving factors: compression socks  PRECAUTIONS: None  WEIGHT BEARING RESTRICTIONS  WBAT   FALLS:  Has patient fallen in last 6 months? Yes. Number of falls 1, uneven ground  LIVING ENVIRONMENT: Lives with: lives with their spouse Lives in: House/apartment Stairs: Yes: Internal: 10 steps; can reach both Has following equipment at home: Crutches  OCCUPATION: work from home, payroll  PLOF: Independent  PATIENT GOALS get rid of crutches and drive again   OBJECTIVE:   DIAGNOSTIC FINDINGS: 12/15/21- X-ray L ankle IMPRESSION: Mild LATERAL soft tissue swelling. No acute bony abnormality. R ankle IMPRESSION: Oblique fracture of the distal fibula with 1.5 mm LATERAL displacement and slight widening of the MEDIAL tibiotalar  joint.  PATIENT SURVEYS:  FOTO ankle 31%, predicted outcome 60% after 16 visits   LE ROM:  Active ROM Right 02/12/2022 Left 02/12/2022  Ankle dorsiflexion -10 0  Ankle plantarflexion 50 50  Ankle inversion 25 36  Ankle eversion 20 30   (Blank rows = not tested)  LE MMT:  MMT Right 02/12/2022 Left 02/12/2022  Hip flexion 5 5  Hip extension 5 5  Hip abduction 5 5  Hip adduction 5 5  Knee flexion 5 5  Knee extension 5 5  Ankle dorsiflexion 4 4+  Ankle plantarflexion 4 4+  Ankle inversion 4 4+  Ankle eversion 4 4+   (Blank rows = not tested)   TODAY'S TREATMENT:  02/25/22 Therapeutic Exercise:   02/18/22 Therapeutic Exercise: Nu Step L4x59min Education on scar massage with demo Aps and ankle circles x 10 R gastroc stretch with towel 2 x 30 R PF/EV/IV with red TB x 10 each R toe curls with red TB x 10  Gait Training: Stairs 4 trials, 13 steps, 7' with CAM boot !70 ft with CAM boot   02/12/22 Therapeutic Exercise: to improve strength and mobility.  Demo, verbal and tactile cues throughout for technique.  Issued YTB, return demo on L ankle today, to perform with both ankles.  -Seated Heel Raise  - 10 reps - Seated Heel Toe Raises- 10 reps - [place]oney Sitting Ankle Eversion with Resistance  10 reps YTB - Marken Sitting Ankle Plantar Flexion with Resistance   10 reps YTB - [place]aught Sitting Ankle Inversion with Resistance  10 reps YTB - [place] Sitting Ankle Dorsiflexion with Anchored Resistance  - 1 x daily 10 reps - YTB - Seated  Ankle Alphabet  demo - Seated Ankle Circles  -demo - Seated Ankle Pumps  demo - Seated Calf Towel Stretch  15 sec hold   PATIENT EDUCATION:  Education details: *** edu on icing and elevation to control swelling, doing ROM exercises with feet elevated, and scar massage to reduce scar tissue Person educated: Patient Education method: Medical illustrator Education comprehension: verbalized understanding and returned demonstration   HOME  EXERCISE PROGRAM: Access Code: [email or web address]   ASSESSMENT:  CLINICAL IMPRESSION: ***  OBJECTIVE IMPAIRMENTS Abnormal gait, decreased activity tolerance, decreased endurance, decreased mobility, difficulty walking, decreased ROM, decreased strength, increased edema, increased fascial restrictions, impaired perceived functional ability, increased muscle spasms, impaired flexibility, and pain.   ACTIVITY LIMITATIONS cleaning, community activity, driving, laundry, yard work, and shopping.   PERSONAL FACTORS 1 comorbidity: history L ankle sprain  are also affecting patient's functional outcome.    GOALS: Goals reviewed with patient? Yes  SHORT TERM GOALS: Target date: 02/26/2022  Patient will be independent with initial HEP. Baseline: given Goal status: IN PROGRESS  2.  Pt will be able to ambulate safely without crutches.  Baseline: using crutches and CAM boot Goal status: IN PROGRESS   [place] TERM GOALS: Target date: 03/26/2022  Patient will be independent with advanced/ongoing HEP to improve outcomes and carryover.  Baseline: update as needed.  Goal status: IN PROGRESS  2.  Patient will report no ankle/foot pain with weight bearing and ambulation.   Baseline: 1/10 R ankle Goal status: IN PROGRESS  3.  Patient will demonstrate improved R ankle AROM to = L ankle AROM  to allow for normal gait and stair mechanics. Baseline: see above Goal status: IN PROGRESS  4.  Patient will demonstrate improved functional LE strength as demonstrated by 5/5 bil ankle strength. Baseline: 4/5 R ankle, 4+/5 L ankle strength Goal status: IN PROGRESS  5.  Patient will be able to ambulate 600' with LRAD and normal gait pattern without increased foot/ankle pain to access community.  Baseline: 65' without crutches today Goal status: IN PROGRESS  6. Patient will be able to ascend/descend stairs with 1 HR and reciprocal step pattern safely to access home and community.  Baseline: step to gait Goal  status: IN PROGRESS  7.  Patient will report 60% on FOTO to demonstrate improved functional ability. Baseline: 31% Goal status: IN PROGRESS  8.  Patient will demonstrate at least 22/30 on FGA to decrease risk of falls. Baseline: not tested today Goal status: IN PROGRESS    PLAN: PT FREQUENCY: 2x/week  PT DURATION: 6 weeks  PLANNED INTERVENTIONS: Therapeutic exercises, Therapeutic activity, Neuromuscular re-education, Balance training, Gait training, Patient/Family education, Joint mobilization, Stair training, Dry Needling, Electrical stimulation, Cryotherapy, Moist heat, Vasopneumatic device, Ultrasound, Ionotophoresis 4mg /ml Dexamethasone, and Manual therapy  PLAN FOR NEXT SESSION:    *** progress ankle strengthening as tolerated.  Progress HEP.   Shandon Burlingame, PT 02/24/2022, 6:16 PM

## 2022-02-25 ENCOUNTER — Other Ambulatory Visit: Payer: Self-pay

## 2022-02-25 ENCOUNTER — Ambulatory Visit: Payer: BC Managed Care – PPO | Admitting: Physical Therapy

## 2022-02-25 ENCOUNTER — Encounter: Payer: Self-pay | Admitting: Physical Therapy

## 2022-02-25 DIAGNOSIS — M25672 Stiffness of left ankle, not elsewhere classified: Secondary | ICD-10-CM | POA: Diagnosis not present

## 2022-02-25 DIAGNOSIS — R2689 Other abnormalities of gait and mobility: Secondary | ICD-10-CM

## 2022-02-25 DIAGNOSIS — M6281 Muscle weakness (generalized): Secondary | ICD-10-CM

## 2022-02-25 DIAGNOSIS — M25671 Stiffness of right ankle, not elsewhere classified: Secondary | ICD-10-CM | POA: Diagnosis not present

## 2022-02-25 DIAGNOSIS — M25571 Pain in right ankle and joints of right foot: Secondary | ICD-10-CM | POA: Diagnosis not present

## 2022-03-03 ENCOUNTER — Ambulatory Visit: Payer: BC Managed Care – PPO | Attending: Orthopedic Surgery | Admitting: Physical Therapy

## 2022-03-03 ENCOUNTER — Encounter: Payer: Self-pay | Admitting: Physical Therapy

## 2022-03-03 DIAGNOSIS — R2689 Other abnormalities of gait and mobility: Secondary | ICD-10-CM | POA: Insufficient documentation

## 2022-03-03 DIAGNOSIS — M25671 Stiffness of right ankle, not elsewhere classified: Secondary | ICD-10-CM | POA: Diagnosis not present

## 2022-03-03 DIAGNOSIS — M25571 Pain in right ankle and joints of right foot: Secondary | ICD-10-CM | POA: Insufficient documentation

## 2022-03-03 DIAGNOSIS — M6281 Muscle weakness (generalized): Secondary | ICD-10-CM | POA: Insufficient documentation

## 2022-03-03 NOTE — Therapy (Signed)
OUTPATIENT PHYSICAL THERAPY TREATMENT    Patient Name: Kimberly Valentine MRN: [id number] DOB:[birth year], 46 y.o., female Today's Date: 03/03/2022   PT End of Session - 03/03/22 0849     Visit Number 4    Number of Visits 12    Date for PT Re-Evaluation 03/26/22    Authorization Type BC[employer]    PT Start Time 0848    PT Stop Time 0930    PT Time Calculation (min) 42 min    Activity Tolerance Patient tolerated treatment well    Behavior During Therapy [facility] for tasks assessed/performed           Rationale for Evaluation and Treatment: Rehabilitation    Past Medical History:  Diagnosis Date   Eczema    hands and feet   Recurrent upper respiratory infection (URI)    Past Surgical History:  Procedure Laterality Date   ABDOMINAL HYSTERECTOMY     GALLBLADDER SURGERY     Patient Active Problem List   Diagnosis Date Noted   Perennial and seasonal allergic rhinitis 08/04/2017   Allergic conjunctivitis 08/04/2017   Dermatitis 08/04/2017    PCP: Jodelle Green, FNP  REFERRING PROVIDER: Renette Butters, MD  REFERRING DIAG: S82.899A (ICD-10-CM) - Ankle fracture  THERAPY DIAG:  Stiffness of right ankle, not elsewhere classified  Pain in right ankle and joints of right foot  Other abnormalities of gait and mobility  Muscle weakness (generalized)  ONSET DATE: 12/15/2021  SUBJECTIVE:   SUBJECTIVE STATEMENT: Kimberly Valentine reports she has not been wearing boot as much, has not worn since last Thursday.   Still has tightness across the top of her R foot.   PERTINENT HISTORY: History HTN, abdominal surgery and hysterectomy.   ORIF R ankle 12/19/21.   PAIN:  Are you having pain?N0  Yes: NPRS scale: 0/10 Pain location: R dorsum of the foot Pain description: pressure Aggravating factors: dependent position gets swelling Relieving factors: compression socks  PRECAUTIONS: None  WEIGHT BEARING RESTRICTIONS  WBAT   FALLS:  Has patient fallen in last 6 months? Yes. Number of  falls 1, uneven ground  LIVING ENVIRONMENT: Lives with: lives with their spouse Lives in: House/apartment Stairs: Yes: Internal: 10 steps; can reach both Has following equipment at home: Crutches  OCCUPATION: work from home, payroll  PLOF: [place] get rid of crutches and drive again   OBJECTIVE:   DIAGNOSTIC FINDINGS: 12/15/21- X-ray L ankle IMPRESSION: Mild LATERAL soft tissue swelling. No acute bony abnormality. R ankle IMPRESSION: Oblique fracture of the distal fibula with 1.5 mm LATERAL displacement and slight widening of the MEDIAL tibiotalar joint.  PATIENT SURVEYS:  FOTO ankle 31%, predicted outcome 60% after 16 visits   LE ROM:  Active ROM Right 02/12/2022 Right A/P 02/25/22 Left 02/12/2022  Ankle dorsiflexion -10 -2/0  0  Ankle plantarflexion 50 63 50  Ankle inversion 25 26/32 36  Ankle eversion 20 34/40 30   (Blank rows = not tested)  LE MMT:  MMT Right 02/12/2022 Left 02/12/2022  Hip flexion 5 5  Hip extension 5 5  Hip abduction 5 5  Hip adduction 5 5  Knee flexion 5 5  Knee extension 5 5  Ankle dorsiflexion 4 4+  Ankle plantarflexion 4 4+  Ankle inversion 4 4+  Ankle eversion 4 4+   (Blank rows = not tested)   TODAY'S TREATMENT: 03/03/2022 Therapeutic Exercise: to improve strength and mobility.  Demo, verbal and tactile cues throughout for technique. Bike L3 x 6 min  BAPS board (seated) L2 - Inv/Ever x 20, PF, DF x 20, CCW and CW circles x 20 Ankle sways on airex 3 x 20 Star excursion pattern 180 deg x 10 bil  Balance board - frontal and sagittal.  Calf stretch on board x 1 min bil prior to sagittal ankle sways.  At bar for safety Tandem stance x 30 sec bil  Tandem walk x 10' SBA but no LOB  Manual Therapy: to decrease muscle spasm and pain and improve mobility  STM/IASTM with s/s tools to R tib ant, soleus, peroneals, mobs to mortise joint  02/25/22 Therapeutic Exercise: Nu Step L6x31min [place] sitting: ankle pumps, circles  CW/CCW x 10 ea, PNF D1/D2 x 10 ea, inv/ever x 10 ea Standing gastroc stretch and soleus stretch 2x 30 sec each; standing mini squats for soleus stretch x 10 Step ups/down : 4 inch step 4 reps ea; down limited by ROM Seated hip IR x 20 bil, [place] sitting IR stretch R x 30 sec; prone bil hip IR x 10 Manual: mobs to cuboid, navicular  Gait: to assess without boot. Limited by tight gastroc/soleus. Decreased stance time R and step length L. Significant ER of R hip.   02/18/22 Therapeutic Exercise: Nu Step L4x54min Education on scar massage with demo Aps and ankle circles x 10 R gastroc stretch with towel 2 x 30 R PF/EV/IV with red TB x 10 each R toe curls with red TB x 10  Gait Training: Stairs 4 trials, 13 steps, 7' with CAM boot !26 ft with CAM boot   PATIENT EDUCATION:  Education details: HEP updated  02/25/22 Person educated: Patient Education method: Explanation and Demonstration Education comprehension: verbalized understanding and returned demonstration   HOME EXERCISE PROGRAM: Access Code: [email or web address]   ASSESSMENT:  CLINICAL IMPRESSION: [name] is making good progress, has weaned out of her boot (hasn't worn since last Thursday) but still having pain dorsal surface foot at mortise joint.  She tolerated progression of exercises well without pain.   Reported decreased tightness in foot following manual therapy.  She would benefit from continued skilled therapy.    OBJECTIVE IMPAIRMENTS Abnormal gait, decreased activity tolerance, decreased endurance, decreased mobility, difficulty walking, decreased ROM, decreased strength, increased edema, increased fascial restrictions, impaired perceived functional ability, increased muscle spasms, impaired flexibility, and pain.   ACTIVITY LIMITATIONS cleaning, community activity, driving, laundry, yard work, and shopping.   PERSONAL FACTORS 1 comorbidity: history L ankle sprain  are also affecting patient's functional outcome.     GOALS: Goals reviewed with patient? Yes  SHORT TERM GOALS: Target date: 02/26/2022  Patient will be independent with initial HEP. Baseline: given Goal status: MET  2.  Pt will be able to ambulate safely without crutches.  Baseline: using crutches and CAM boot Goal status: MET   [place] TERM GOALS: Target date: 03/26/2022  Patient will be independent with advanced/ongoing HEP to improve outcomes and carryover.  Baseline: update as needed.  Goal status: IN PROGRESS  2.  Patient will report no ankle/foot pain with weight bearing and ambulation.   Baseline: 1/10 R ankle Goal status: IN PROGRESS  3.  Patient will demonstrate improved R ankle AROM to = L ankle AROM  to allow for normal gait and stair mechanics. Baseline: see above Goal status: IN PROGRESS  4.  Patient will demonstrate improved functional LE strength as demonstrated by 5/5 bil ankle strength. Baseline: 4/5 R ankle, 4+/5 L ankle strength Goal status: IN PROGRESS  5.  Patient will be able  to ambulate 600' with LRAD and normal gait pattern without increased foot/ankle pain to access community.  Baseline: 21' without crutches today Goal status: IN PROGRESS  6. Patient will be able to ascend/descend stairs with 1 HR and reciprocal step pattern safely to access home and community.  Baseline: step to gait Goal status: IN PROGRESS  7.  Patient will report 60% on FOTO to demonstrate improved functional ability. Baseline: 31% Goal status: IN PROGRESS  8.  Patient will demonstrate at least 22/30 on FGA to decrease risk of falls. Baseline: not tested today Goal status: IN PROGRESS    PLAN: PT FREQUENCY: 2x/week  PT DURATION: 6 weeks  PLANNED INTERVENTIONS: Therapeutic exercises, Therapeutic activity, Neuromuscular re-education, Balance training, Gait training, Patient/Family education, Joint mobilization, Stair training, Dry Needling, Electrical stimulation, Cryotherapy, Moist heat, Vasopneumatic device,  Ultrasound, Ionotophoresis 47m/ml Dexamethasone, and Manual therapy  PLAN FOR NEXT SESSION:    progress ankle ROM/strengthening as tolerated.  Progress HEP. Gait/stairs.   ERennie Natter PT, DPT  03/03/2022, 11:59 AM

## 2022-03-06 ENCOUNTER — Encounter: Payer: BC Managed Care – PPO | Admitting: Physical Therapy

## 2022-03-10 ENCOUNTER — Ambulatory Visit: Payer: BC Managed Care – PPO

## 2022-03-12 DIAGNOSIS — S82841D Displaced bimalleolar fracture of right lower leg, subsequent encounter for closed fracture with routine healing: Secondary | ICD-10-CM | POA: Diagnosis not present

## 2022-03-13 ENCOUNTER — Encounter: Payer: BC Managed Care – PPO | Admitting: Physical Therapy

## 2022-03-17 ENCOUNTER — Ambulatory Visit: Payer: BC Managed Care – PPO

## 2022-03-20 ENCOUNTER — Encounter: Payer: BC Managed Care – PPO | Admitting: Physical Therapy

## 2022-03-27 ENCOUNTER — Encounter: Payer: BC Managed Care – PPO | Admitting: Physical Therapy

## 2022-04-05 ENCOUNTER — Other Ambulatory Visit: Payer: Self-pay

## 2022-04-05 ENCOUNTER — Emergency Department (HOSPITAL_BASED_OUTPATIENT_CLINIC_OR_DEPARTMENT_OTHER)
Admission: EM | Admit: 2022-04-05 | Discharge: 2022-04-06 | Disposition: A | Discharge status: Home / Self Care | Payer: BC Managed Care – PPO | Attending: Emergency Medicine | Admitting: Emergency Medicine

## 2022-04-05 ENCOUNTER — Emergency Department (HOSPITAL_BASED_OUTPATIENT_CLINIC_OR_DEPARTMENT_OTHER): Payer: BC Managed Care – PPO

## 2022-04-05 ENCOUNTER — Encounter (HOSPITAL_BASED_OUTPATIENT_CLINIC_OR_DEPARTMENT_OTHER): Payer: Self-pay | Admitting: Emergency Medicine

## 2022-04-05 DIAGNOSIS — H53149 Visual discomfort, unspecified: Secondary | ICD-10-CM | POA: Diagnosis not present

## 2022-04-05 DIAGNOSIS — R519 Headache, unspecified: Secondary | ICD-10-CM | POA: Insufficient documentation

## 2022-04-05 LAB — BASIC METABOLIC PANEL
Anion gap: 4 — ABNORMAL LOW (ref 5–15)
BUN: 19 mg/dL (ref 6–20)
CO2: 30 mmol/L (ref 22–32)
Calcium: 8.8 mg/dL — ABNORMAL LOW (ref 8.9–10.3)
Chloride: 103 mmol/L (ref 98–111)
Creatinine, Ser: 1.02 mg/dL — ABNORMAL HIGH (ref 0.44–1.00)
GFR, Estimated: >60 mL/min (ref >60)
Glucose, Bld: 88 mg/dL (ref 70–99)
Potassium: 4.3 mmol/L (ref 3.5–5.1)
Sodium: 137 mmol/L (ref 135–145)

## 2022-04-05 LAB — CBC WITH DIFFERENTIAL/PLATELET
Abs Immature Granulocytes: 0.02 10*3/uL (ref 0.00–0.07)
Basophils Absolute: 0 10*3/uL (ref 0.0–0.1)
Basophils Relative: 1 %
Eosinophils Absolute: 0.2 10*3/uL (ref 0.0–0.5)
Eosinophils Relative: 3 %
HCT: 40.9 % (ref 36.0–46.0)
Hemoglobin: 14.1 g/dL (ref 12.0–15.0)
Immature Granulocytes: 0 %
Lymphocytes Relative: 32 %
Lymphs Abs: 2.3 10*3/uL (ref 0.7–4.0)
MCH: 29.9 pg (ref 26.0–34.0)
MCHC: 34.5 g/dL (ref 30.0–36.0)
MCV: 86.8 fL (ref 80.0–100.0)
Monocytes Absolute: 0.5 10*3/uL (ref 0.1–1.0)
Monocytes Relative: 7 %
Neutro Abs: 4.1 10*3/uL (ref 1.7–7.7)
Neutrophils Relative %: 57 %
Platelets: 228 10*3/uL (ref 150–400)
RBC: 4.71 MIL/uL (ref 3.87–5.11)
RDW: 12.5 % (ref 11.5–15.5)
WBC: 7.1 10*3/uL (ref 4.0–10.5)
nRBC: 0 % (ref 0.0–0.2)

## 2022-04-05 MED ORDER — DEXAMETHASONE SODIUM PHOSPHATE 4 MG/ML IJ SOLN
4.0000 mg | Freq: Once | INTRAMUSCULAR | Status: AC
  Administered 2022-04-05: 4 mg via INTRAVENOUS
  Filled 2022-04-05: qty 1

## 2022-04-05 MED ORDER — METOCLOPRAMIDE HCL 5 MG/ML IJ SOLN
10.0000 mg | Freq: Once | INTRAMUSCULAR | Status: AC
  Administered 2022-04-05: 10 mg via INTRAVENOUS
  Filled 2022-04-05: qty 2

## 2022-04-05 MED ORDER — SODIUM CHLORIDE 0.9 % IV BOLUS
1000.0000 mL | Freq: Once | INTRAVENOUS | Status: AC
  Administered 2022-04-05: 1000 mL via INTRAVENOUS

## 2022-04-05 MED ORDER — DIPHENHYDRAMINE HCL 50 MG/ML IJ SOLN
25.0000 mg | Freq: Once | INTRAMUSCULAR | Status: AC
  Administered 2022-04-05: 25 mg via INTRAVENOUS
  Filled 2022-04-05: qty 1

## 2022-04-05 NOTE — ED Triage Notes (Signed)
Reports headache that started today.  Took fioricet and flexeril with no relief.

## 2022-04-05 NOTE — ED Provider Notes (Signed)
[facility] EMERGENCY DEPARTMENT Provider Note   CSN: [id number] Arrival date & time: 04/05/22  1957     History  Chief Complaint  Patient presents with   Headache    Kimberly Valentine is a 46 y.o. female history of tension headache, here presenting with worsening headache.  Patient woke up with a frontal headache this morning. She took some Fioricet and Flexeril as prescribed by her doctor for tension headache.  She then fell asleep and woke up around 7 PM and had sudden onset of worsening headache.  She has some photophobia as well.  Denies any fevers.  Denies any trouble speaking or focal weakness.  Patient and never had this type of headache before.  Patient never had any brain imaging in the past.  The history is provided by the patient.       Home Medications Prior to Admission medications   Medication Sig Start Date End Date Taking? Authorizing Provider  Azelastine-Fluticasone 137-50 MCG/ACT SUSP Place into the nose.    [provider]  escitalopram (LEXAPRO) 10 MG tablet  05/14/17   [provider]  levocetirizine (XYZAL) 5 MG tablet Take 5 mg every evening by mouth.    [provider]  losartan-hydrochlorothiazide Mauri Reading) 100-12.5 MG tablet  06/25/17   [provider]  Olopatadine HCl (PATADAY) 0.2 % SOLN Place 1 drop daily as needed into both eyes. 08/04/17   Bobbitt, Heywood Iles, MD  oxyCODONE-acetaminophen (PERCOCET/ROXICET) 5-325 MG tablet Take 1 tablet by mouth every 6 (six) hours as needed for severe pain. 12/15/21   Hyman Hopes, Margaux, PA-C  triamcinolone cream (KENALOG) 0.1 % Apply 1 application 2 (two) times daily as needed topically. 08/04/17   Bobbitt, Heywood Iles, MD      Allergies    Septra [sulfamethoxazole-trimethoprim]    Review of Systems   Review of Systems  Neurological:  Positive for headaches.  All other systems reviewed and are negative.   Physical Exam Updated Vital Signs BP 127/78   Pulse 72   Temp 98.4  F (36.9 C) (Oral)   Resp 18   Ht 5\' 7"  (1.702 m)   Wt 122.5 kg   SpO2 97%   BMI 42.29 kg/m  Physical Exam Vitals and nursing note reviewed.  Constitutional:      Comments: Uncomfortable  HENT:     Head: Normocephalic.  Eyes:     Extraocular Movements: Extraocular movements intact.     Pupils: Pupils are equal, round, and reactive to light.  Neck:     Comments: No meningeal signs Cardiovascular:     Rate and Rhythm: Normal rate and regular rhythm.     Heart sounds: Normal heart sounds.  Pulmonary:     Effort: Pulmonary effort is normal.     Breath sounds: Normal breath sounds.  Abdominal:     General: Bowel sounds are normal.     Palpations: Abdomen is soft.  Musculoskeletal:        General: Normal range of motion.     Cervical back: Normal range of motion and neck supple.  Skin:    General: Skin is warm.  Neurological:     Mental Status: She is alert and oriented to person, place, and time.     Comments: Cranial nerves II to XII intact.  Patient has normal strength and sensation bilateral upper and lower extremities.  Patient has normal finger-to-nose bilaterally   Psychiatric:        Mood and Affect: Mood normal.  Behavior: Behavior normal.     ED Results / Procedures / Treatments   Labs (all labs ordered are listed, but only abnormal results are displayed) Labs Reviewed  BASIC METABOLIC PANEL - Abnormal; Notable for the following components:      Result Value   Creatinine, Ser 1.02 (*)    Calcium 8.8 (*)    Anion gap 4 (*)    All other components within normal limits  CBC WITH DIFFERENTIAL/PLATELET    EKG None  Radiology CT Head Wo Contrast  Result Date: 04/05/2022 CLINICAL DATA:  New onset headaches for several hours, initial encounter EXAM: CT HEAD WITHOUT CONTRAST TECHNIQUE: Contiguous axial images were obtained from the base of the skull through the vertex without intravenous contrast. RADIATION DOSE REDUCTION: This exam was performed according  to the departmental dose-optimization program which includes automated exposure control, adjustment of the mA and/or kV according to patient size and/or use of iterative reconstruction technique. COMPARISON:  None Available. FINDINGS: Brain: No evidence of acute infarction, hemorrhage, hydrocephalus, extra-axial collection or mass lesion/mass effect. Vascular: No hyperdense vessel or unexpected calcification. Skull: Normal. Negative for fracture or focal lesion. Sinuses/Orbits: No acute finding. Other: None. IMPRESSION: No acute intracranial abnormality noted. Electronically Signed   By: Alcide Clever M.D.   On: 04/05/2022 23:06    Procedures Procedures    Medications Ordered in ED Medications  metoCLOPramide (REGLAN) injection 10 mg (10 mg Intravenous Given 04/05/22 2252)  diphenhydrAMINE (BENADRYL) injection 25 mg (25 mg Intravenous Given 04/05/22 2247)  sodium chloride 0.9 % bolus 1,000 mL (1,000 mLs Intravenous New Bag/Given 04/05/22 2246)  dexamethasone (DECADRON) injection 4 mg (4 mg Intravenous Given 04/05/22 2250)    ED Course/ Medical Decision Making/ A&P                           Medical Decision Making Kimberly Valentine is a 46 y.o. female here presenting with headaches.  She has tension headache but she had sudden onset of worsening headache at 7 PM today.  Concern for possible subarachnoid hemorrhage versus worsening headache versus migraines. Plan to get CBC and BMP and CT head.  We will give migraine cocktail and reassess   11:15 PM I reviewed labs and CT head.  Labs and CT head unremarkable.  Patient felt better after migraine cocktail.  Stable for discharge.  I told her to continue Fioricet and follow-up with neurology  Problems Addressed: Nonintractable headache, unspecified chronicity pattern, unspecified headache type: acute illness or injury  Amount and/or Complexity of Data Reviewed Labs: ordered. Decision-making details documented in ED Course. Radiology: ordered and independent  interpretation performed. Decision-making details documented in ED Course.  Risk Prescription drug management.    Final Clinical Impression(s) / ED Diagnoses Final diagnoses:  None    Rx / DC Orders ED Discharge Orders     None         Charlynne Pander, MD 04/05/22 2318

## 2022-04-05 NOTE — Discharge Instructions (Signed)
Continue Tylenol and Motrin for headache.  Please take Fioricet as prescribed by your doctor for severe headache  Your CT scan and labs were normal today  You can call [place] [facility] for appointment with a headache specialist if you have persistent headaches  Return to ER if you have worse headache, vomiting, fever

## 2022-04-17 ENCOUNTER — Encounter: Payer: Self-pay | Admitting: Nurse Practitioner

## 2022-04-17 ENCOUNTER — Ambulatory Visit: Payer: BC Managed Care – PPO | Admitting: Nurse Practitioner

## 2022-04-17 VITALS — BP 118/85 | HR 92 | Temp 97.6°F | Wt 276.0 lb

## 2022-04-17 DIAGNOSIS — J3089 Other allergic rhinitis: Secondary | ICD-10-CM

## 2022-04-17 DIAGNOSIS — E538 Deficiency of other specified B group vitamins: Secondary | ICD-10-CM | POA: Diagnosis not present

## 2022-04-17 DIAGNOSIS — F419 Anxiety disorder, unspecified: Secondary | ICD-10-CM

## 2022-04-17 DIAGNOSIS — E559 Vitamin D deficiency, unspecified: Secondary | ICD-10-CM

## 2022-04-17 DIAGNOSIS — J029 Acute pharyngitis, unspecified: Secondary | ICD-10-CM | POA: Diagnosis not present

## 2022-04-17 DIAGNOSIS — Z114 Encounter for screening for human immunodeficiency virus [HIV]: Secondary | ICD-10-CM | POA: Diagnosis not present

## 2022-04-17 DIAGNOSIS — G4700 Insomnia, unspecified: Secondary | ICD-10-CM

## 2022-04-17 DIAGNOSIS — Z1159 Encounter for screening for other viral diseases: Secondary | ICD-10-CM | POA: Diagnosis not present

## 2022-04-17 DIAGNOSIS — I10 Essential (primary) hypertension: Secondary | ICD-10-CM | POA: Diagnosis not present

## 2022-04-17 DIAGNOSIS — E782 Mixed hyperlipidemia: Secondary | ICD-10-CM

## 2022-04-17 DIAGNOSIS — F32A Depression, unspecified: Secondary | ICD-10-CM | POA: Insufficient documentation

## 2022-04-17 LAB — COMPREHENSIVE METABOLIC PANEL
ALT: 15 U/L (ref 0–35)
AST: 15 U/L (ref 0–37)
Albumin: 3.9 g/dL (ref 3.5–5.2)
Alkaline Phosphatase: 59 U/L (ref 39–117)
BUN: 12 mg/dL (ref 6–23)
CO2: 30 mEq/L (ref 19–32)
Calcium: 8.5 mg/dL (ref 8.4–10.5)
Chloride: 103 mEq/L (ref 96–112)
Creatinine, Ser: 0.83 mg/dL (ref 0.40–1.20)
GFR: 83.5 mL/min (ref >60.00)
Glucose, Bld: 96 mg/dL (ref 70–99)
Potassium: 4.1 mEq/L (ref 3.5–5.1)
Sodium: 141 mEq/L (ref 135–145)
Total Bilirubin: 0.4 mg/dL (ref 0.2–1.2)
Total Protein: 6.2 g/dL (ref 6.0–8.3)

## 2022-04-17 LAB — CBC WITH DIFFERENTIAL/PLATELET
Basophils Absolute: 0 10*3/uL (ref 0.0–0.1)
Basophils Relative: 0.4 % (ref 0.0–3.0)
Eosinophils Absolute: 0.1 10*3/uL (ref 0.0–0.7)
Eosinophils Relative: 1.8 % (ref 0.0–5.0)
HCT: 42.3 % (ref 36.0–46.0)
Hemoglobin: 14.2 g/dL (ref 12.0–15.0)
Lymphocytes Relative: 28.4 % (ref 12.0–46.0)
Lymphs Abs: 1.6 10*3/uL (ref 0.7–4.0)
MCHC: 33.6 g/dL (ref 30.0–36.0)
MCV: 87.3 fl (ref 78.0–100.0)
Monocytes Absolute: 0.3 10*3/uL (ref 0.1–1.0)
Monocytes Relative: 4.6 % (ref 3.0–12.0)
Neutro Abs: 3.6 10*3/uL (ref 1.4–7.7)
Neutrophils Relative %: 64.8 % (ref 43.0–77.0)
Platelets: 199 10*3/uL (ref 150.0–400.0)
RBC: 4.84 Mil/uL (ref 3.87–5.11)
RDW: 13 % (ref 11.5–15.5)
WBC: 5.6 10*3/uL (ref 4.0–10.5)

## 2022-04-17 LAB — LIPID PANEL
Cholesterol: 197 mg/dL (ref 0–200)
HDL: 54.9 mg/dL (ref >39.00)
LDL Cholesterol: 112 mg/dL — ABNORMAL HIGH (ref 0–99)
NonHDL: 141.79
Total CHOL/HDL Ratio: 4
Triglycerides: 151 mg/dL — ABNORMAL HIGH (ref 0.0–149.0)
VLDL: 30.2 mg/dL (ref 0.0–40.0)

## 2022-04-17 LAB — VITAMIN D 25 HYDROXY (VIT D DEFICIENCY, FRACTURES): VITD: 30.24 ng/mL (ref 30.00–100.00)

## 2022-04-17 LAB — VITAMIN B12: Vitamin B-12: 832 pg/mL (ref 211–911)

## 2022-04-17 LAB — POCT RAPID STREP A (OFFICE): Rapid Strep A Screen: NEGATIVE

## 2022-04-17 MED ORDER — BUPROPION HCL ER (XL) 150 MG PO TB24: 150.0000 mg | ORAL_TABLET | Freq: Every day | ORAL | 1 refills | Status: DC

## 2022-04-17 MED ORDER — MONTELUKAST SODIUM 10 MG PO TABS: 10.0000 mg | ORAL_TABLET | Freq: Every day | ORAL | 1 refills | Status: DC

## 2022-04-17 MED ORDER — TRAZODONE HCL 50 MG PO TABS: 25.0000 mg | ORAL_TABLET | Freq: Every evening | ORAL | 1 refills | Status: DC | PRN

## 2022-04-17 MED ORDER — ESCITALOPRAM OXALATE 20 MG PO TABS: 20.0000 mg | ORAL_TABLET | Freq: Every day | ORAL | 1 refills | Status: DC

## 2022-04-17 MED ORDER — OLMESARTAN MEDOXOMIL 40 MG PO TABS
40.0000 mg | ORAL_TABLET | Freq: Every day | ORAL | 1 refills | Status: DC
Start: 2022-04-17 — End: 2022-10-23

## 2022-04-17 NOTE — Patient Instructions (Addendum)
It was great to see you!  We are checking your labs today and will let you know the results via mychart/phone.   I have refilled your medications.   Start trazadone 1/2 tablet to 1 table at bedtime for sleep. You can also develop a bedtime routine, shutting off electronic devices an hour before you go to bed.   You can continue tylenol and gargle with warm salt water as needed for sore throat.   Let's follow-up in 4-6 weeks, may be virtual, sooner if you have concerns.  If a referral was placed today, you will be contacted for an appointment. Please note that routine referrals can sometimes take up to 3-4 weeks to process. Please call our office if you haven't heard anything after this time frame.  Take care,  Rodman Pickle, NP

## 2022-04-17 NOTE — Assessment & Plan Note (Signed)
She has been having trouble sleeping and gets about 3 hours of sleep every night.  This causes fatigue in the morning.  She states that she had a sleep study done several years ago that was negative for sleep apnea.  She has tried Ambien in the past, however it made her sleep talk.  We will have her start trazodone 25 to 50 mg as needed at bedtime for sleep.  Follow-up in 4 to 6 weeks.

## 2022-04-17 NOTE — Assessment & Plan Note (Signed)
History of elevated cholesterol, will check lipid panel and adjust regimen based on results.

## 2022-04-17 NOTE — Progress Notes (Signed)
New Patient Office Visit  Subjective    Patient ID: Kimberly Valentine, female    DOB: 10-31-1975  Age: 46 y.o. MRN: [id number]  CC:  Chief Complaint  Patient presents with   Establish Care    HPI Kimberly Valentine presents for new patient visit to establish care.  Introduced to Publishing rights manager role and practice setting.  All questions answered.  Discussed provider/patient relationship and expectations.  She has a history of hypertension. It is well controlled with olmesartan 40mg  daily. She has not been checking her blood pressure at home. She denies chest pain and shortness of breath.   She has a history of headaches for several years. She recently has been having more tension headaches. She went to the ER 04/05/22 for the headache. She was referred to neurology and is in the process of setting up an appointent.   She has a history of allergies and was getting allergy injections. She has not needed the injections recently. She is taking singulair and zyrtec. She has had a sore throat for the past 2 days. She does feel some post nasal drip and some nasal congestion. She denies fever. She has been taking tylenol.   She has a history of depression and anxiety. Overall her symptoms are well controlled. She has not seen therapist in the past. She has trouble sleeping at night. She has tried 06/06/22 in the past and it caused her to sleep talk. She is currently taking benadryl and melatonin to help her sleep. She does not snore at night. She had a sleep study 10-15 years ago that was negative for sleep apnea.      04/17/2022   10:11 AM  Depression screen PHQ 2/9  Decreased Interest 2  Down, Depressed, Hopeless 0  PHQ - 2 Score 2  Altered sleeping 3  Tired, decreased energy 3  Change in appetite 2  Feeling bad or failure about yourself  1  Trouble concentrating 1  Moving slowly or fidgety/restless 0  Suicidal thoughts 1  PHQ-9 Score 13  Difficult doing work/chores Somewhat difficult       04/17/2022   10:12 AM  GAD 7 : Generalized Anxiety Score  Nervous, Anxious, on Edge 1  Control/stop worrying 0  Worry too much - different things 2  Trouble relaxing 3  Restless 0  Easily annoyed or irritable 1  Afraid - awful might happen 0  Total GAD 7 Score 7  Anxiety Difficulty Somewhat difficult    Outpatient Encounter Medications as of 04/17/2022  Medication Sig   Cyanocobalamin (VITAMIN B12 PO) Take by mouth.   traZODone (DESYREL) 50 MG tablet Take 0.5-1 tablets (25-50 mg total) by mouth at bedtime as needed for sleep.   VITAMIN D PO Take by mouth.   [DISCONTINUED] buPROPion (WELLBUTRIN XL) 150 MG 24 hr tablet Take 1 tablet by mouth daily.   [DISCONTINUED] cyclobenzaprine (FLEXERIL) 10 MG tablet 1/2 to 1 tablet twice daily   [DISCONTINUED] escitalopram (LEXAPRO) 20 MG tablet Take 20 mg by mouth daily.   [DISCONTINUED] hydrochlorothiazide (HYDRODIURIL) 12.5 MG tablet    buPROPion (WELLBUTRIN XL) 150 MG 24 hr tablet Take 1 tablet (150 mg total) by mouth daily.   cetirizine (ZYRTEC) 10 MG tablet Take by mouth.   escitalopram (LEXAPRO) 20 MG tablet Take 1 tablet (20 mg total) by mouth daily.   meloxicam (MOBIC) 15 MG tablet Take 15 mg by mouth as needed.   montelukast (SINGULAIR) 10 MG tablet Take 1 tablet (10 mg total) by  mouth daily.   olmesartan (BENICAR) 40 MG tablet Take 1 tablet (40 mg total) by mouth daily.   omeprazole (PRILOSEC) 20 MG capsule Take 20 mg by mouth daily.   oxyCODONE-acetaminophen (PERCOCET/ROXICET) 5-325 MG tablet Take 1 tablet by mouth every 6 (six) hours as needed for severe pain.   [DISCONTINUED] ACETAMINOPHEN EXTRA STRENGTH 500 MG capsule Take 2 capsules by mouth 3 (three) times daily as needed.   [DISCONTINUED] Albuterol Sulfate (PROAIR RESPICLICK) 123XX123 (90 Base) MCG/ACT AEPB    [DISCONTINUED] aspirin 81 MG chewable tablet Chew 81 mg by mouth 2 (two) times daily.   [DISCONTINUED] azelastine (OPTIVAR) 0.05 % ophthalmic solution    [DISCONTINUED]  Azelastine-Fluticasone 137-50 MCG/ACT SUSP Place into the nose.   [DISCONTINUED] escitalopram (LEXAPRO) 10 MG tablet 20 mg.   [DISCONTINUED] fluticasone (FLONASE) 50 MCG/ACT nasal spray Spray 1 spray every day by intranasal route.   [DISCONTINUED] gatifloxacin (ZYMAXID) 0.5 % SOLN 1 drop 3 (three) times daily.   [DISCONTINUED] levocetirizine (XYZAL) 5 MG tablet Take 5 mg every evening by mouth.   [DISCONTINUED] losartan (COZAAR) 100 MG tablet Take 1 tablet by mouth daily.   [DISCONTINUED] losartan-hydrochlorothiazide (HYZAAR) 100-12.5 MG tablet    [DISCONTINUED] LOTEMAX 0.5 % ophthalmic suspension 1 drop 3 (three) times daily.   [DISCONTINUED] montelukast (SINGULAIR) 10 MG tablet Take 10 mg by mouth daily.   [DISCONTINUED] olmesartan (BENICAR) 40 MG tablet Take 40 mg by mouth daily.   [DISCONTINUED] Olopatadine HCl (PATADAY) 0.2 % SOLN Place 1 drop daily as needed into both eyes.   [DISCONTINUED] telmisartan-hydrochlorothiazide (MICARDIS HCT) 80-12.5 MG tablet Please specify directions, refills and quantity   [DISCONTINUED] triamcinolone cream (KENALOG) 0.1 % Apply 1 application 2 (two) times daily as needed topically.   [DISCONTINUED] zolpidem (AMBIEN) 10 MG tablet Take 10 mg by mouth at bedtime.   No facility-administered encounter medications on file as of 04/17/2022.    Past Medical History:  Diagnosis Date   Allergy    Depression    Eczema    hands and feet   Headache    Hypertension    Recurrent upper respiratory infection (URI)     Past Surgical History:  Procedure Laterality Date   ABDOMINAL HYSTERECTOMY     CHOLECYSTECTOMY     EYE SURGERY  01/02/22   Lasik   FRACTURE SURGERY  12/19/21   Broke Rt Ankle   GALLBLADDER SURGERY      Family History  Problem Relation Age of Onset   Alcohol abuse Mother    Depression Mother    Diabetes Mother    Hypertension Mother    Varicose Veins Mother    Arthritis Father    Cancer Father        prostate   COPD Father    Depression  Father    Hearing loss Father    Hypertension Father    36 / [place] Sister    Varicose Veins Sister    Depression Daughter    Diabetes Daughter    Obesity Daughter    Cancer Paternal Grandmother        colon   Cancer Paternal Grandfather        lung   COPD Paternal Grandfather     Social History   Socioeconomic History   Marital status: Married    Spouse name: Not on file   Number of children: Not on file   Years of education: Not on file   Highest education level: Not on file  Occupational History   Not  on file  Tobacco Use   Smoking status: Never   Smokeless tobacco: Never  Vaping Use   Vaping Use: Never used  Substance and Sexual Activity   Alcohol use: Not Currently   Drug use: No   Sexual activity: Yes    Birth control/protection: Surgical  Other Topics Concern   Not on file  Social History Narrative   Not on file   Social Determinants of Health   Financial Resource Strain: Not on file  Food Insecurity: Not on file  Transportation Needs: Not on file  Physical Activity: Not on file  Stress: Not on file  Social Connections: Not on file  Intimate Partner Violence: Not on file    Review of Systems  Constitutional:  Positive for malaise/fatigue. Negative for fever.  HENT:  Positive for congestion and sore throat. Negative for ear pain.   Eyes: Negative.   Respiratory: Negative.    Cardiovascular: Negative.   Gastrointestinal: Negative.   Genitourinary: Negative.   Musculoskeletal:  Positive for joint pain (right ankle at times).  Skin: Negative.   Neurological:  Positive for headaches. Negative for dizziness.  Psychiatric/Behavioral:  Positive for depression. The patient is nervous/anxious.      Objective    BP 118/85 (BP Location: Left Arm, Patient Position: Sitting, Cuff Size: Large)   Pulse 92   Temp 97.6 F (36.4 C) (Temporal)   Wt 276 lb (125.2 kg)   SpO2 94%   BMI 43.23 kg/m   Physical Exam Vitals and nursing note  reviewed.  Constitutional:      General: She is not in acute distress.    Appearance: Normal appearance.  HENT:     Head: Normocephalic and atraumatic.     Right Ear: Tympanic membrane, ear canal and external ear normal.     Left Ear: Tympanic membrane, ear canal and external ear normal.     Mouth/Throat:     Pharynx: Posterior oropharyngeal erythema present.  Eyes:     Conjunctiva/sclera: Conjunctivae normal.  Cardiovascular:     Rate and Rhythm: Normal rate and regular rhythm.     Pulses: Normal pulses.     Heart sounds: Normal heart sounds.  Pulmonary:     Effort: Pulmonary effort is normal.     Breath sounds: Normal breath sounds.  Abdominal:     Palpations: Abdomen is soft.     Tenderness: There is no abdominal tenderness.  Musculoskeletal:        General: Normal range of motion.     Cervical back: Normal range of motion and neck supple. No tenderness.  Lymphadenopathy:     Cervical: No cervical adenopathy.  Skin:    General: Skin is warm and dry.  Neurological:     General: No focal deficit present.     Mental Status: She is alert and oriented to person, place, and time.  Psychiatric:        Mood and Affect: Mood normal.        Behavior: Behavior normal.        Thought Content: Thought content normal.        Judgment: Judgment normal.         Assessment & Plan:   Problem List Items Addressed This Visit       Cardiovascular and Mediastinum   Primary hypertension    Chronic, stable.  BP today 118/85.  Continue olmesartan 40 mg daily.  Refill sent to the pharmacy.  Check CMP, CBC today.      Relevant  Medications   olmesartan (BENICAR) 40 MG tablet   Other Relevant Orders   CBC with Differential/Platelet (Completed)   Comprehensive metabolic panel     Respiratory   Perennial and seasonal allergic rhinitis    She has a history of seasonal allergies and was taking allergy injections.  Now she is currently taking Singulair 10 mg daily and Zyrtec 10 mg  daily.  Continue ongoing regimen.  Follow-up with any concerns.  Refill of Singulair sent to the pharmacy.        Other   Anxiety and depression    Chronic, stable.  She is currently taking bupropion 150 mg daily and Lexapro 20 mg daily.  This regimen is working well for her, she denies SI/HI.  Refill sent to the pharmacy.  Follow-up in 6 months.      Relevant Medications   buPROPion (WELLBUTRIN XL) 150 MG 24 hr tablet   escitalopram (LEXAPRO) 20 MG tablet   traZODone (DESYREL) 50 MG tablet   Mixed hyperlipidemia    History of elevated cholesterol, will check lipid panel and adjust regimen based on results.      Relevant Medications   olmesartan (BENICAR) 40 MG tablet   Other Relevant Orders   Lipid panel   Vitamin D deficiency    She has a history of vitamin D deficiency and is taking an over-the-counter vitamin D supplement.  We will check levels today and adjust regimen based on results.      Relevant Orders   VITAMIN D 25 Hydroxy (Vit-D Deficiency, Fractures)   Vitamin B12 deficiency    She has a history of B12 deficiency and is currently taking a supplement.  We will check vitamin B12 levels today.      Relevant Orders   Vitamin B12   Insomnia    She has been having trouble sleeping and gets about 3 hours of sleep every night.  This causes fatigue in the morning.  She states that she had a sleep study done several years ago that was negative for sleep apnea.  She has tried Ambien in the past, however it made her sleep talk.  We will have her start trazodone 25 to 50 mg as needed at bedtime for sleep.  Follow-up in 4 to 6 weeks.      Other Visit Diagnoses     Sore throat    -  Primary   Rapid strep test negative in office. Continue singulair and zyrtec. Gargle with warm salt water as needed for pain   Relevant Orders   POCT rapid strep A   Encounter for hepatitis C screening test for low risk patient       Screen hepatitis C   Relevant Orders   Hepatitis C antibody    Screening for HIV (human immunodeficiency virus)       Screen HIV   Relevant Orders   HIV Antibody (routine testing w rflx)       Return in about 4 weeks (around 05/15/2022) for 4-6 weeks insomnia, may be virtual.   Gerre Scull, NP

## 2022-04-17 NOTE — Assessment & Plan Note (Signed)
Chronic, stable.  She is currently taking bupropion 150 mg daily and Lexapro 20 mg daily.  This regimen is working well for her, she denies SI/HI.  Refill sent to the pharmacy.  Follow-up in 6 months.

## 2022-04-17 NOTE — Assessment & Plan Note (Signed)
Chronic, stable.  BP today 118/85.  Continue olmesartan 40 mg daily.  Refill sent to the pharmacy.  Check CMP, CBC today.

## 2022-04-17 NOTE — Assessment & Plan Note (Signed)
She has a history of seasonal allergies and was taking allergy injections.  Now she is currently taking Singulair 10 mg daily and Zyrtec 10 mg daily.  Continue ongoing regimen.  Follow-up with any concerns.  Refill of Singulair sent to the pharmacy.

## 2022-04-17 NOTE — Assessment & Plan Note (Signed)
She has a history of vitamin D deficiency and is taking an over-the-counter vitamin D supplement.  We will check levels today and adjust regimen based on results.

## 2022-04-17 NOTE — Assessment & Plan Note (Signed)
She has a history of B12 deficiency and is currently taking a supplement.  We will check vitamin B12 levels today.

## 2022-04-18 ENCOUNTER — Encounter: Payer: Self-pay | Admitting: Nurse Practitioner

## 2022-04-18 LAB — HEPATITIS C ANTIBODY: Hepatitis C Ab: NONREACTIVE

## 2022-04-18 LAB — HIV ANTIBODY (ROUTINE TESTING W REFLEX): HIV 1&2 Ab, 4th Generation: NONREACTIVE

## 2022-04-29 ENCOUNTER — Encounter: Payer: Self-pay | Admitting: Nurse Practitioner

## 2022-04-29 MED ORDER — DOXEPIN HCL 3 MG PO TABS
3.0000 mg | ORAL_TABLET | Freq: Every evening | ORAL | 0 refills | Status: DC | PRN
Start: 2022-04-29 — End: 2022-05-28

## 2022-05-15 NOTE — Progress Notes (Signed)
[facility] [address] [place] [address] Dept: [phone] Dept Fax: [phone]  Virtual Video Visit  I connected with Kimberly Valentine on 05/16/22 at  8:00 AM EDT by a video enabled telemedicine application and verified that I am speaking with the correct person using two identifiers.  Location patient: Home Location provider: Clinic Persons participating in the virtual visit: Patient; Rodman Pickle, NP  I discussed the limitations of evaluation and management by telemedicine and the availability of in person appointments. The patient expressed understanding and agreed to proceed.  No chief complaint on file.   SUBJECTIVE:  HPI: Kimberly Valentine is a 46 y.o. female who presents to follow-up on insomnia. She was started on trazodone, however developed swelling, congestion, and was unable to tolerate the medication. She was then switched to doxepin.  She states that doxepin is doing well and she does not have any side effects.  She has been sleeping through the night so for the past 2 nights where she did wake up at one point.  She is feeling more rested.  She denies any other concerns today.  Patient Active Problem List   Diagnosis Date Noted   Anxiety and depression 04/17/2022   Primary hypertension 04/17/2022   Mixed hyperlipidemia 04/17/2022   Vitamin D deficiency 04/17/2022   Vitamin B12 deficiency 04/17/2022   Insomnia 04/17/2022   Perennial and seasonal allergic rhinitis 08/04/2017    Past Surgical History:  Procedure Laterality Date   ABDOMINAL HYSTERECTOMY     CHOLECYSTECTOMY     EYE SURGERY  01/02/22   Lasik   FRACTURE SURGERY  12/19/21   Broke Rt Ankle   GALLBLADDER SURGERY      Family History  Problem Relation Age of Onset   Alcohol abuse Mother    Depression Mother    Diabetes Mother    Hypertension Mother    Varicose Veins Mother    Arthritis Father    Cancer Father        prostate   COPD Father     Depression Father    Hearing loss Father    Hypertension Father    Miscarriages / [place] Sister    Varicose Veins Sister    Depression Daughter    Diabetes Daughter    Obesity Daughter    Cancer Paternal Grandmother        colon   Cancer Paternal Grandfather        lung   COPD Paternal Grandfather     Social History   Tobacco Use   Smoking status: Never   Smokeless tobacco: Never  Vaping Use   Vaping Use: Never used  Substance Use Topics   Alcohol use: Not Currently   Drug use: No     Current Outpatient Medications:    Vitamin D, Ergocalciferol, (DRISDOL) 1.25 MG (50000 UNIT) CAPS capsule, Take 1 capsule (50,000 Units total) by mouth every 7 (seven) days., Disp: 8 capsule, Rfl: 0   buPROPion (WELLBUTRIN XL) 150 MG 24 hr tablet, Take 1 tablet (150 mg total) by mouth daily., Disp: 90 tablet, Rfl: 1   cetirizine (ZYRTEC) 10 MG tablet, Take by mouth., Disp: , Rfl:    Cyanocobalamin (VITAMIN B12 PO), Take by mouth., Disp: , Rfl:    Doxepin HCl 3 MG TABS, Take 1 tablet (3 mg total) by mouth at bedtime as needed (sleep)., Disp: 30 tablet, Rfl: 0   escitalopram (LEXAPRO) 20 MG tablet, Take 1 tablet (20 mg total) by mouth  daily., Disp: 90 tablet, Rfl: 1   meloxicam (MOBIC) 15 MG tablet, Take 15 mg by mouth as needed., Disp: , Rfl:    montelukast (SINGULAIR) 10 MG tablet, Take 1 tablet (10 mg total) by mouth daily., Disp: 90 tablet, Rfl: 1   olmesartan (BENICAR) 40 MG tablet, Take 1 tablet (40 mg total) by mouth daily., Disp: 90 tablet, Rfl: 1   omeprazole (PRILOSEC) 20 MG capsule, Take 20 mg by mouth daily., Disp: , Rfl:    oxyCODONE-acetaminophen (PERCOCET/ROXICET) 5-325 MG tablet, Take 1 tablet by mouth every 6 (six) hours as needed for severe pain., Disp: 15 tablet, Rfl: 0   VITAMIN D PO, Take by mouth., Disp: , Rfl:   Allergies  Allergen Reactions   Septra [Sulfamethoxazole-Trimethoprim] Hives and Shortness Of Breath   Grass Pollen(K-[id number]-Swt Vern)    Trazodone And  Nefazodone Other (See Comments)    Light headed    ROS: See pertinent positives and negatives per HPI.  OBSERVATIONS/OBJECTIVE:  VITALS per patient if applicable: There were no vitals filed for this visit. There is no height or weight on file to calculate BMI.    GENERAL: Alert and oriented. Appears well and in no acute distress.  HEENT: Atraumatic. Conjunctiva clear. No obvious abnormalities on inspection of external nose and ears.  NECK: Normal movements of the head and neck.  LUNGS: On inspection, no signs of respiratory distress. Breathing rate appears normal. No obvious gross SOB, gasping or wheezing, and no conversational dyspnea.  CV: No obvious cyanosis.  MS: Moves all visible extremities without noticeable abnormality.  PSYCH/NEURO: Pleasant and cooperative. No obvious depression or anxiety. Speech and thought processing grossly intact.  ASSESSMENT AND PLAN:  Problem List Items Addressed This Visit       Other   Vitamin D deficiency    Her last vitamin D was 30.  She is currently taking vitamin D 5000 units daily.  We will have her stop this for 2 months and start prescription strength vitamin D 50,000 units once a week.  After finishing this resume vitamin D 5000 units daily.      Insomnia - Primary    Her insomnia is doing better.  She did not tolerate the trazodone and is now taking doxepin 3mg  as needed at bedtime.  She states this is doing well and she has been able to sleep through the night most nights.  Continue doxepin 3 mg daily as needed        I discussed the assessment and treatment plan with the patient. The patient was provided an opportunity to ask questions and all were answered. The patient agreed with the plan and demonstrated an understanding of the instructions.   The patient was advised to call back or seek an in-person evaluation if the symptoms worsen or if the condition fails to improve as anticipated.   , NP

## 2022-05-16 ENCOUNTER — Ambulatory Visit: Payer: 59 | Admitting: Psychiatry

## 2022-05-16 ENCOUNTER — Telehealth (INDEPENDENT_AMBULATORY_CARE_PROVIDER_SITE_OTHER): Payer: BC Managed Care – PPO | Admitting: Nurse Practitioner

## 2022-05-16 ENCOUNTER — Encounter: Payer: Self-pay | Admitting: Nurse Practitioner

## 2022-05-16 DIAGNOSIS — E559 Vitamin D deficiency, unspecified: Secondary | ICD-10-CM | POA: Diagnosis not present

## 2022-05-16 DIAGNOSIS — G4700 Insomnia, unspecified: Secondary | ICD-10-CM

## 2022-05-16 MED ORDER — VITAMIN D (ERGOCALCIFEROL) 1.25 MG (50000 UNIT) PO CAPS: 50000.0000 [IU] | ORAL_CAPSULE | ORAL | 0 refills | Status: DC

## 2022-05-16 NOTE — Assessment & Plan Note (Signed)
Her insomnia is doing better.  She did not tolerate the trazodone and is now taking doxepin 3mg  as needed at bedtime.  She states this is doing well and she has been able to sleep through the night most nights.  Continue doxepin 3 mg daily as needed

## 2022-05-16 NOTE — Patient Instructions (Signed)
It was great to see you!  Continue doxepin 3 mg as needed at bedtime.  Stop your vitamin D for 2 months and start the prescription vitamin D once a week.  Then resume your over-the-counter vitamin D supplement daily.  Let's follow-up in 5 months, sooner if you have concerns.  If a referral was placed today, you will be contacted for an appointment. Please note that routine referrals can sometimes take up to 3-4 weeks to process. Please call our office if you haven't heard anything after this time frame.  Take care,  Rodman Pickle, NP

## 2022-05-16 NOTE — Assessment & Plan Note (Signed)
Her last vitamin D was 30.  She is currently taking vitamin D 5000 units daily.  We will have her stop this for 2 months and start prescription strength vitamin D 50,000 units once a week.  After finishing this resume vitamin D 5000 units daily.

## 2022-05-28 ENCOUNTER — Other Ambulatory Visit: Payer: Self-pay | Admitting: Nurse Practitioner

## 2022-06-04 ENCOUNTER — Ambulatory Visit: Payer: BC Managed Care – PPO | Admitting: Psychiatry

## 2022-06-06 DIAGNOSIS — Z139 Encounter for screening, unspecified: Secondary | ICD-10-CM | POA: Diagnosis not present

## 2022-06-06 DIAGNOSIS — Z1231 Encounter for screening mammogram for malignant neoplasm of breast: Secondary | ICD-10-CM | POA: Diagnosis not present

## 2022-06-10 LAB — HM MAMMOGRAPHY

## 2022-06-23 ENCOUNTER — Encounter: Payer: Self-pay | Admitting: Nurse Practitioner

## 2022-06-23 ENCOUNTER — Ambulatory Visit (INDEPENDENT_AMBULATORY_CARE_PROVIDER_SITE_OTHER): Payer: BC Managed Care – PPO | Admitting: Nurse Practitioner

## 2022-06-23 VITALS — BP 140/90 | HR 82 | Temp 97.2°F | Wt 279.2 lb

## 2022-06-23 DIAGNOSIS — J0140 Acute pansinusitis, unspecified: Secondary | ICD-10-CM | POA: Diagnosis not present

## 2022-06-23 MED ORDER — PREDNISONE 10 MG PO TABS: ORAL_TABLET | ORAL | 0 refills | Status: DC

## 2022-06-23 MED ORDER — AMOXICILLIN-POT CLAVULANATE 875-125 MG PO TABS: 1.0000 | ORAL_TABLET | Freq: Two times a day (BID) | ORAL | 0 refills | Status: DC

## 2022-06-23 MED ORDER — DOXEPIN HCL 3 MG PO TABS: 3.0000 mg | ORAL_TABLET | Freq: Every evening | ORAL | 1 refills | Status: DC | PRN

## 2022-06-23 NOTE — Patient Instructions (Signed)
It was great to see you!  Start prednisone taper, 6 tablets today, 5 tomorrow, 4 the next day, keep decreasing by 1 every day until gone. Also start augmentin twice a day for 10 days. You can continue your medications you are taking over the counter.   Let's follow-up if your symptoms worsen or don't improve.   Take care,  Vance Peper, NP

## 2022-06-23 NOTE — Progress Notes (Signed)
Acute Office Visit  Subjective:     Patient ID: Kimberly Valentine, female    DOB: 24-Dec-1975, 46 y.o.   MRN: [id number]  Chief Complaint  Patient presents with   Acute Visit    Pt c/o sinus pressure, sinus pain, nasal drainage w/ mild cough x6 wks    HPI Patient is in today for sinus congestion and pressure intermittently for the past 6 weeks.   UPPER RESPIRATORY TRACT INFECTION  Fever: yes - subjective at times Cough: yes Shortness of breath: no Wheezing: no Chest pain: no Chest tightness: no Chest congestion: no Nasal congestion: yes Runny nose: yes Post nasal drip: yes Sneezing: yes Sore throat: yes Swollen glands: no Sinus pressure: yes Headache: yes Face pain: yes Toothache: no Ear pain: no bilateral Ear pressure: yes bilateral Eyes red/itching:yes Eye drainage/crusting: no  Vomiting: no Rash: no Fatigue: yes Sick contacts: no Strep contacts: no  Context: fluctuating Recurrent sinusitis: no Relief with OTC cold/cough medications: no  Treatments attempted: mucinex, mucinex sinus, tylenol   ROS See pertinent positives and negatives per HPI.     Objective:    BP (!) 140/90   Pulse 82   Temp (!) 97.2 F (36.2 C) (Temporal)   Wt 279 lb 3.2 oz (126.6 kg)   SpO2 95%   BMI 43.73 kg/m    Physical Exam Vitals and nursing note reviewed.  Constitutional:      General: She is not in acute distress.    Appearance: Normal appearance.  HENT:     Head: Normocephalic.     Right Ear: Tympanic membrane, ear canal and external ear normal.     Left Ear: Tympanic membrane, ear canal and external ear normal.     Nose:     Right Sinus: Maxillary sinus tenderness and frontal sinus tenderness present.     Left Sinus: Maxillary sinus tenderness and frontal sinus tenderness present.     Mouth/Throat:     Pharynx: Posterior oropharyngeal erythema present. No oropharyngeal exudate.  Eyes:     Conjunctiva/sclera: Conjunctivae normal.  Cardiovascular:     Rate and  Rhythm: Normal rate and regular rhythm.     Pulses: Normal pulses.     Heart sounds: Normal heart sounds.  Pulmonary:     Effort: Pulmonary effort is normal.     Breath sounds: Normal breath sounds.  Musculoskeletal:     Cervical back: Normal range of motion and neck supple. No tenderness.  Lymphadenopathy:     Cervical: No cervical adenopathy.  Skin:    General: Skin is warm.  Neurological:     General: No focal deficit present.     Mental Status: She is alert and oriented to person, place, and time.  Psychiatric:        Mood and Affect: Mood normal.        Behavior: Behavior normal.        Thought Content: Thought content normal.        Judgment: Judgment normal.       Assessment & Plan:   Problem List Items Addressed This Visit   None Visit Diagnoses     Acute non-recurrent pansinusitis    -  Primary   Symptoms x6 weeks. Start augmentin BID x10 days. Will also start prednisone taper. Encourage rest, fluids. Can start flonase. F/U if not improving.    Relevant Medications   amoxicillin-clavulanate (AUGMENTIN) 875-125 MG tablet   predniSONE (DELTASONE) 10 MG tablet       Meds ordered this  encounter  Medications   Doxepin HCl 3 MG TABS    Sig: Take 1 tablet (3 mg total) by mouth at bedtime as needed (sleep).    Dispense:  90 tablet    Refill:  1   amoxicillin-clavulanate (AUGMENTIN) 875-125 MG tablet    Sig: Take 1 tablet by mouth 2 (two) times daily.    Dispense:  20 tablet    Refill:  0   predniSONE (DELTASONE) 10 MG tablet    Sig: Take 6 tablets today, then 5 tablets tomorrow, then decrease by 1 tablet every day until gone    Dispense:  21 tablet    Refill:  0    Return if symptoms worsen or fail to improve.  Gerre Scull, NP

## 2022-07-29 DIAGNOSIS — H6983 Other specified disorders of Eustachian tube, bilateral: Secondary | ICD-10-CM | POA: Diagnosis not present

## 2022-07-29 DIAGNOSIS — H6593 Unspecified nonsuppurative otitis media, bilateral: Secondary | ICD-10-CM | POA: Diagnosis not present

## 2022-09-30 ENCOUNTER — Other Ambulatory Visit: Payer: Self-pay | Admitting: Nurse Practitioner

## 2022-10-09 NOTE — Telephone Encounter (Signed)
Chart supports Rx Last OV: 05/2022 Next OV: 09/2022  

## 2022-10-14 ENCOUNTER — Encounter: Payer: Self-pay | Admitting: Nurse Practitioner

## 2022-10-14 ENCOUNTER — Ambulatory Visit (INDEPENDENT_AMBULATORY_CARE_PROVIDER_SITE_OTHER): Payer: No Typology Code available for payment source | Admitting: Nurse Practitioner

## 2022-10-14 VITALS — BP 138/84 | HR 80 | Temp 96.6°F | Ht 67.0 in | Wt 273.8 lb

## 2022-10-14 DIAGNOSIS — Z0001 Encounter for general adult medical examination with abnormal findings: Secondary | ICD-10-CM | POA: Diagnosis not present

## 2022-10-14 DIAGNOSIS — F419 Anxiety disorder, unspecified: Secondary | ICD-10-CM | POA: Diagnosis not present

## 2022-10-14 DIAGNOSIS — J324 Chronic pansinusitis: Secondary | ICD-10-CM | POA: Diagnosis not present

## 2022-10-14 DIAGNOSIS — F32A Depression, unspecified: Secondary | ICD-10-CM

## 2022-10-14 DIAGNOSIS — I10 Essential (primary) hypertension: Secondary | ICD-10-CM

## 2022-10-14 DIAGNOSIS — G4700 Insomnia, unspecified: Secondary | ICD-10-CM | POA: Diagnosis not present

## 2022-10-14 MED ORDER — VENLAFAXINE HCL ER 75 MG PO CP24: 75.0000 mg | ORAL_CAPSULE | Freq: Every day | ORAL | 1 refills | Status: DC

## 2022-10-14 MED ORDER — PREDNISONE 10 MG PO TABS: ORAL_TABLET | ORAL | 0 refills | Status: DC

## 2022-10-14 MED ORDER — AZELASTINE-FLUTICASONE 137-50 MCG/ACT NA SUSP: 1.0000 | Freq: Two times a day (BID) | NASAL | 1 refills | Status: DC

## 2022-10-14 MED ORDER — ZEPBOUND 2.5 MG/0.5ML ~~LOC~~ SOAJ: 2.5000 mg | SUBCUTANEOUS | 1 refills | Status: DC

## 2022-10-14 NOTE — Assessment & Plan Note (Signed)
BMI is 42.8. She has tried tracking foods and doing weight watchers in the past. She lost 30 pounds with this, however gained it back. She has not been exercising recently due to increased depression symptoms, however has been exercising before this. She is interested in medication or possibly weight loss surgery. Discussed tracking food, increasing fruits, vegetables, and proteins. Will have her start zepbound 2.5mg  injection weekly. Discussed possible side effects. Follow-up in 6-8 weeks.

## 2022-10-14 NOTE — Addendum Note (Signed)
Addended by: Vance Peper A on: 10/14/2022 04:29 PM   Modules accepted: Orders

## 2022-10-14 NOTE — Assessment & Plan Note (Signed)
Chronic, stable. Continue doxepin 3mg  at bedtime as needed.

## 2022-10-14 NOTE — Progress Notes (Signed)
BP 138/84   Pulse 80   Temp (!) 96.6 F (35.9 C)   Ht 5\' 7"  (1.702 m)   Wt 273 lb 12.8 oz (124.2 kg)   SpO2 97%   BMI 42.88 kg/m    Subjective:    Patient ID: Kimberly Valentine, female    DOB: [birth year], 47 y.o.   MRN: [id number]  CC: Chief Complaint  Patient presents with   Annual Exam    Physical , depression  and dicuss sinus , weight loss  options pt is fasting for labs     HPI: Kimberly Valentine is a 47 y.o. female presenting on 10/14/2022 for comprehensive medical examination. Current medical complaints include: depression, sinus pressure, weight management  She has noticed worsening depression. She is taking lexapro 30mg  daily and wellbutrin 150mg  daily. She has been having crying spells, not wanting to get up, and has noticed some irritability. She denies SI/HI. She has not done therapy and is not interested in this. She has been on lexapro for several years and wellbutrin for 1 year.   She has been having ongoing sinus pressure and pain along with headaches. She is not sure if this is an infection versus just inflammation. She was on an antibiotic in November for a sinus infection. She also feels like her ears are blocked. She is currently taking zyrtec and singulair daily. She was taking dymista nasal spray in the past which had helped.   She has been overweight for the past 27 years. She did weight watchers and lost 30 pounds, however as soon as anything went wrong in her life, she stopped the program and gained weight. 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 She is looking for a [employer] term fix. She has not exericsed recentyl due to her worsening depression.   She currently lives with: husband, mother in law Menopausal Symptoms: yes - hot flashes  Depression Screen done today and results listed below:     10/14/2022   10:05 AM 04/17/2022   10:11 AM  Depression screen PHQ 2/9  Decreased Interest 2 2  Down, Depressed, Hopeless 3 0  PHQ - 2 Score 5 2  Altered sleeping 2 3   Tired, decreased energy 3 3  Change in appetite 2 2  Feeling bad or failure about yourself  1 1  Trouble concentrating 1 1  Moving slowly or fidgety/restless 0 0  Suicidal thoughts 1 1  PHQ-9 Score 15 13  Difficult doing work/chores Very difficult Somewhat difficult      10/14/2022   10:05 AM 04/17/2022   10:12 AM  GAD 7 : Generalized Anxiety Score  Nervous, Anxious, on Edge 1 1  Control/stop worrying 1 0  Worry too much - different things 1 2  Trouble relaxing 2 3  Restless 1 0  Easily annoyed or irritable 2 1  Afraid - awful might happen 0 0  Total GAD 7 Score 8 7  Anxiety Difficulty Somewhat difficult Somewhat difficult   The patient does not have a history of falls. I did not complete a risk assessment for falls. A plan of care for falls was not documented.   Past Medical History:  Past Medical History:  Diagnosis Date   Allergy    Depression    Eczema    hands and feet   Headache    Hypertension    Recurrent upper respiratory infection (URI)     Surgical History:  Past Surgical History:  Procedure Laterality Date  ABDOMINAL HYSTERECTOMY     CHOLECYSTECTOMY     EYE SURGERY  01/02/22   Lasik   FRACTURE SURGERY  12/19/21   Broke Rt Ankle   GALLBLADDER SURGERY      Medications:  Current Outpatient Medications on File Prior to Visit  Medication Sig   buPROPion (WELLBUTRIN XL) 150 MG 24 hr tablet TAKE 1 TABLET BY MOUTH EVERY DAY   cetirizine (ZYRTEC) 10 MG tablet Take by mouth.   Cyanocobalamin (VITAMIN B12 PO) Take by mouth.   Doxepin HCl 3 MG TABS Take 1 tablet (3 mg total) by mouth at bedtime as needed (sleep).   montelukast (SINGULAIR) 10 MG tablet Take 1 tablet (10 mg total) by mouth daily.   olmesartan (BENICAR) 40 MG tablet Take 1 tablet (40 mg total) by mouth daily.   omeprazole (PRILOSEC) 20 MG capsule Take 20 mg by mouth daily.   VITAMIN D PO Take by mouth.   No current facility-administered medications on file prior to visit.    Allergies:   Allergies  Allergen Reactions   Septra [Sulfamethoxazole-Trimethoprim] Hives and Shortness Of Breath   Grass Pollen(K-O-R-T-Swt Vern)    Trazodone And Nefazodone Other (See Comments)    Light headed    Social History:  Social History   Socioeconomic History   Marital status: Married    Spouse name: Not on file   Number of children: Not on file   Years of education: Not on file   Highest education level: Not on file  Occupational History   Not on file  Tobacco Use   Smoking status: Never   Smokeless tobacco: Never  Vaping Use   Vaping Use: Never used  Substance and Sexual Activity   Alcohol use: Not Currently   Drug use: No   Sexual activity: Yes    Birth control/protection: Surgical  Other Topics Concern   Not on file  Social History Narrative   Not on file   Social Determinants of Health   Financial Resource Strain: Not on file  Food Insecurity: Not on file  Transportation Needs: Not on file  Physical Activity: Not on file  Stress: Not on file  Social Connections: Not on file  Intimate Partner Violence: Not on file   Social History   Tobacco Use  Smoking Status Never  Smokeless Tobacco Never   Social History   Substance and Sexual Activity  Alcohol Use Not Currently    Family History:  Family History  Problem Relation Age of Onset   Alcohol abuse Mother    Depression Mother    Diabetes Mother    Hypertension Mother    Varicose Veins Mother    Arthritis Father    Cancer Father        prostate   COPD Father    Depression Father    Hearing loss Father    Hypertension Father    Miscarriages / Stillbirths Sister    Varicose Veins Sister    Depression Daughter    Diabetes Daughter    Obesity Daughter    Cancer Paternal Grandmother        colon   Cancer Paternal Grandfather        lung   COPD Paternal Grandfather     Past medical history, surgical history, medications, allergies, family history and social history reviewed with patient  today and changes made to appropriate areas of the chart.   Review of Systems  Constitutional:  Positive for malaise/fatigue. Negative for fever.  HENT:  Positive  for congestion and sinus pain. Negative for sore throat.   Eyes: Negative.   Respiratory: Negative.    Cardiovascular: Negative.   Gastrointestinal: Negative.   Genitourinary: Negative.   Musculoskeletal: Negative.   Skin: Negative.   Neurological:  Positive for headaches. Negative for dizziness.  Psychiatric/Behavioral:  Positive for depression. The patient is not nervous/anxious.    All other ROS negative except what is listed above and in the HPI.      Objective:    BP 138/84   Pulse 80   Temp (!) 96.6 F (35.9 C)   Ht 5\' 7"  (1.702 m)   Wt 273 lb 12.8 oz (124.2 kg)   SpO2 97%   BMI 42.88 kg/m   Wt Readings from Last 3 Encounters:  10/14/22 273 lb 12.8 oz (124.2 kg)  06/23/22 279 lb 3.2 oz (126.6 kg)  04/17/22 276 lb (125.2 kg)    Physical Exam Vitals and nursing note reviewed.  Constitutional:      General: She is not in acute distress.    Appearance: Normal appearance.  HENT:     Head: Normocephalic and atraumatic.     Right Ear: Tympanic membrane, ear canal and external ear normal.     Left Ear: Tympanic membrane, ear canal and external ear normal.     Nose:     Right Sinus: Maxillary sinus tenderness and frontal sinus tenderness present.     Left Sinus: Maxillary sinus tenderness and frontal sinus tenderness present.  Eyes:     Conjunctiva/sclera: Conjunctivae normal.  Cardiovascular:     Rate and Rhythm: Normal rate and regular rhythm.     Pulses: Normal pulses.     Heart sounds: Normal heart sounds.  Pulmonary:     Effort: Pulmonary effort is normal.     Breath sounds: Normal breath sounds.  Abdominal:     Palpations: Abdomen is soft.     Tenderness: There is no abdominal tenderness.  Musculoskeletal:        General: Normal range of motion.     Cervical back: Normal range of motion and  neck supple. No tenderness.     Right lower leg: No edema.     Left lower leg: No edema.  Lymphadenopathy:     Cervical: No cervical adenopathy.  Skin:    General: Skin is warm and dry.  Neurological:     General: No focal deficit present.     Mental Status: She is alert and oriented to person, place, and time.     Cranial Nerves: No cranial nerve deficit.     Coordination: Coordination normal.     Gait: Gait normal.  Psychiatric:        Mood and Affect: Mood normal.        Behavior: Behavior normal.        Thought Content: Thought content normal.        Judgment: Judgment normal.     Results for orders placed or performed in visit on 06/10/22  HM MAMMOGRAPHY  Result Value Ref Range   HM Mammogram 0-4 Bi-Rad 0-4 Bi-Rad, Self Reported Normal      Assessment & Plan:   Problem List Items Addressed This Visit       Cardiovascular and Mediastinum   Primary hypertension    Chronic, stable. Continue olmesartan 40mg  daily. Follow-up in 6 months.         Other   Anxiety and depression    Chronic, not controlled. Her depression symptoms have worsened over  the last few months. She denies SI/HI. Her PHQ 9 is a 15 and her GAD 7 is an 8. She has been on lexapro for several years and this may not be working like it used to. Will have her stop the lexapro and start venlafaxine 75mg  daily. Continue wellbutrin 150mg  daily. She declines referral to a therapist at this time. Follow-up in 6 weeks.       Relevant Medications   venlafaxine XR (EFFEXOR XR) 75 MG 24 hr capsule   Insomnia    Chronic, stable. Continue doxepin 3mg  at bedtime as needed.       Morbid obesity (HCC)    BMI is 42.8. She has tried tracking foods and doing weight watchers in the past. She lost 30 pounds with this, however gained it back. She has not been exercising recently due to increased depression symptoms, however has been exercising before this. She is interested in medication or possibly weight loss surgery.  Discussed tracking food, increasing fruits, vegetables, and proteins. Will have her start zepbound 2.5mg  injection weekly. Discussed possible side effects. Follow-up in 6-8 weeks.       Other Visit Diagnoses     Encounter for general adult medical examination with abnormal findings    -  Primary   Health maintenance reviewed and updated. Discussed nutrition, exercise. Recent labs reviewed. Follow-up 1 year.   Chronic pansinusitis       Will treat with prednisone taper. Does not need antibiotics at this time. Will also refill Dymista. Referral placed to allergist.   Relevant Medications   Azelastine-Fluticasone 137-50 MCG/ACT SUSP   predniSONE (DELTASONE) 10 MG tablet   Other Relevant Orders   Ambulatory referral to Allergy        Follow up plan: Return in about 6 weeks (around 11/25/2022) for Anxiety, Depression.   LABORATORY TESTING:  - Pap smear: not applicable  IMMUNIZATIONS:   - Tdap: Tetanus vaccination status reviewed: last tetanus booster within 10 years. - Influenza: Refused - Pneumovax: Not applicable - Prevnar: Not applicable - HPV: Not applicable - Zostavax vaccine: Not applicable  SCREENING: -Mammogram: Up to date  - Colonoscopy: Up to date  - Bone Density: Not applicable  -Hearing Test: Not applicable  -Spirometry: Not applicable   PATIENT COUNSELING:   Advised to take 1 mg of folate supplement per day if capable of pregnancy.   Sexuality: Discussed sexually transmitted diseases, partner selection, use of condoms, avoidance of unintended pregnancy  and contraceptive alternatives.   Advised to avoid cigarette smoking.  I discussed with the patient that most people either abstain from alcohol or drink within safe limits (<=14/week and <=4 drinks/occasion for males, <=7/weeks and <= 3 drinks/occasion for females) and that the risk for alcohol disorders and other health effects rises proportionally with the number of drinks per week and how often a drinker  exceeds daily limits.  Discussed cessation/primary prevention of drug use and availability of treatment for abuse.   Diet: Encouraged to adjust caloric intake to maintain  or achieve ideal body weight, to reduce intake of dietary saturated fat and total fat, to limit sodium intake by avoiding high sodium foods and not adding table salt, and to maintain adequate dietary potassium and calcium preferably from fresh fruits, vegetables, and low-fat dairy products.    stressed the importance of regular exercise  Injury prevention: Discussed safety belts, safety helmets, smoke detector, smoking near bedding or upholstery.   Dental health: Discussed importance of regular tooth brushing, flossing, and dental visits.  NEXT PREVENTATIVE PHYSICAL DUE IN 1 YEAR. Return in about 6 weeks (around 11/25/2022) for Anxiety, Depression.

## 2022-10-14 NOTE — Assessment & Plan Note (Addendum)
Chronic, not controlled. Her depression symptoms have worsened over the last few months. She denies SI/HI. Her PHQ 9 is a 15 and her GAD 7 is an 8. She has been on lexapro for several years and this may not be working like it used to. Will have her stop the lexapro and start venlafaxine 75mg  daily. Continue wellbutrin 150mg  daily. She declines referral to a therapist at this time. Follow-up in 6 weeks.

## 2022-10-14 NOTE — Assessment & Plan Note (Signed)
Chronic, stable. Continue olmesartan 40mg  daily. Follow-up in 6 months.

## 2022-10-14 NOTE — Patient Instructions (Signed)
It was great to see you!  Stop the lexapro, start effexor 1 capsule daily. Keep taking the wellbutrin  Start prednisone taper, 6 tablets today, 5 tomorrow 4 the next day, then keep decreasing by 1 every day until gone. I have placed a referral to an allergist as well.   Start zepbound injection once a week. This can help with weight loss. Make sure you drink plenty of water   Let's follow-up in 6-8 weeks, sooner if you have concerns.  If a referral was placed today, you will be contacted for an appointment. Please note that routine referrals can sometimes take up to 3-4 weeks to process. Please call our office if you haven't heard anything after this time frame.  Take care,  Vance Peper, NP

## 2022-10-23 ENCOUNTER — Other Ambulatory Visit: Payer: Self-pay | Admitting: Nurse Practitioner

## 2022-10-23 NOTE — Telephone Encounter (Signed)
Chart supports Rx Last OV: 09/2022 Next OV: 10/2022  

## 2022-10-24 ENCOUNTER — Telehealth: Payer: Self-pay | Admitting: Pharmacy Technician

## 2022-10-24 MED ORDER — SAXENDA 18 MG/3ML ~~LOC~~ SOPN: PEN_INJECTOR | SUBCUTANEOUS | 0 refills | Status: DC

## 2022-10-24 NOTE — Telephone Encounter (Signed)
Patient notifed of message that insurance denied Zepbound and that Lauren sent in a new Rx for Saxenda. She will call if any further isses.

## 2022-10-24 NOTE — Telephone Encounter (Signed)
Received Non-formulary PA for Zepbound   Available Formulary Alternatives: orlistat, QSYMIA, SAXENDA, WEGOVY

## 2022-10-28 ENCOUNTER — Telehealth: Payer: Self-pay

## 2022-10-28 NOTE — Telephone Encounter (Signed)
PA for Saxenda 18 mg submitted through cover may meds.  Awaiting response.  Dm/cma   Key: [id number]

## 2022-10-29 NOTE — Progress Notes (Signed)
NEW PATIENT Date of Service/Encounter:  10/31/22 Referring provider: Charyl Dancer, NP Primary care provider: Charyl Dancer, NP  Subjective:  Kimberly Valentine is a 47 y.o. female with a PMHx of seasonal and perennial allergic rhinitis, anxiety, depression, hypertension, vitamin D deficiency, vitamin B12 deficiency, insomnia, obestiy presenting today for evaluation of recurrent sinusitis. History obtained from: chart review and patient.   Chronic rhinitis: started in high school Symptoms include: congestion and post nasal drainage.  Fluid in her ears Occurs year-round with flares in spring and fall Potential triggers: Timothy hay from [place] pigs. Environmental exposures: [place] pigs, dogs Treatments tried: using generic on dymista, alternating AH (claritin, allegra and xyzal). Kimberly Valentine is what she is currently taking. She has not tried atrovent nasal spray. Previous allergy testing:  [place] AIT[place]  [place].  It was somewhat helpful, but now moved [place] the [place] and symptoms have returned. History of reflux/heartburn:  occasionally-will take prilosec PRN and TUMS as needed-depends on what she eats, using around 1-2 times every 1-2 weeks Previous sinus, ear, tonsil, adenoid surgeries: no  Eczema:  Flares on hands and feet. Has not had a flare lately, at least a year. She has some ointments at home that are steroids but does not know name.  She gets hand blisters.    Previous patient of Dr. Loa Socks seen 2018. Allergic rhinitis- moved [facility] from [facility] in May 2018  Environmental skin testing: Positive to grass pollen molds, dog epithelia, and dust mite antigen. Food allergen skin testing: Negative despite a positive histamine control plan to restart AIT.   Other allergy screening: Asthma: no Food allergy: no Medication allergy:  septra and trazadone; avoids sulfas Hymenoptera allergy:   no, but did have a large local reaction to wasp Urticaria: no History of recurrent infections suggestive of immunodeficency: no-gets recurrent sinus infections and either is treated with steroids or antibiotics. Typically gets them 4 to 5 times per year.  Never hospitalized for an infection. No prior pneumonias. She has not yet her yearly flu vaccines.  Has not had pneumonia vaccines.  He is up-to-date with COVID vaccines. Has had recent tetanus booster. Vaccinations are up to date.   Past Medical History: Past Medical History:  Diagnosis Date   Allergy    Depression    Eczema    hands and feet   Headache    Hypertension    Recurrent upper respiratory infection (URI)    Medication List:  Current Outpatient Medications  Medication Sig Dispense Refill   acetaZOLAMIDE ER (DIAMOX) 500 MG capsule      Azelastine-Fluticasone 137-50 MCG/ACT SUSP Place 1 spray into the nose every 12 (twelve) hours. 23 g 1   buPROPion (WELLBUTRIN XL) 150 MG 24 hr tablet TAKE 1 TABLET BY MOUTH EVERY DAY 90 tablet 1   cetirizine (ZYRTEC) 10 MG tablet Take by mouth.     Cyanocobalamin (VITAMIN B12 PO) Take by mouth.     Doxepin HCl 3 MG TABS Take 1 tablet (3 mg total) by mouth at bedtime as needed (sleep). 90 tablet 1   fexofenadine (ALLEGRA) 180 MG tablet Take 1 tablet every day by oral route.     ibuprofen (ADVIL) 600 MG tablet      levocetirizine (XYZAL) 5 MG tablet      montelukast (SINGULAIR) 10 MG tablet Take 1 tablet (10 mg total) by mouth daily. 90 tablet 1   olmesartan (  BENICAR) 40 MG tablet TAKE 1 TABLET BY MOUTH EVERY DAY 90 tablet 1   venlafaxine XR (EFFEXOR XR) 75 MG 24 hr capsule Take 1 capsule (75 mg total) by mouth daily with breakfast. 30 capsule 1   VITAMIN D PO Take by mouth.     cefaclor (CECLOR CD) 500 MG 12 hr tablet Take 1 tablet by mouth every 12 (twelve) hours. (Patient not taking: Reported on 10/31/2022)     Liraglutide -Weight Management (SAXENDA) 18 MG/3ML SOPN Inject 0.6 mg into the  skin daily for 7 days, THEN 1.2 mg daily for 7 days, THEN 1.8 mg daily for 7 days, THEN 2.4 mg daily for 7 days, THEN 3 mg daily. (Patient not taking: Reported on 10/31/2022) 56.5 mL 0   omeprazole (PRILOSEC) 20 MG capsule Take 20 mg by mouth daily. (Patient not taking: Reported on 10/31/2022)     predniSONE (DELTASONE) 10 MG tablet Take 6 tablets today, then 5 tablets tomorrow, then decrease by 1 tablet every day until gone (Patient not taking: Reported on 10/31/2022) 21 tablet 0   No current facility-administered medications for this visit.   Known Allergies:  Allergies  Allergen Reactions   Septra [Sulfamethoxazole-Trimethoprim] Hives and Shortness Of Breath   Grass Pollen(K-O-R-T-Swt Vern)    Trazodone And Nefazodone Other (See Comments)    Light headed   Past Surgical History: Past Surgical History:  Procedure Laterality Date   ABDOMINAL HYSTERECTOMY     CHOLECYSTECTOMY     EYE SURGERY  01/02/22   Lasik   FRACTURE SURGERY  12/19/21   Broke Rt Ankle   GALLBLADDER SURGERY     Family History: Family History  Problem Relation Age of Onset   Alcohol abuse Mother    Depression Mother    Diabetes Mother    Hypertension Mother    Varicose Veins Mother    Arthritis Father    Cancer Father        prostate   COPD Father    Depression Father    Hearing loss Father    Hypertension Father    75 / Stillbirths Sister    Varicose Veins Sister    Allergic rhinitis Sister    Lupus Sister    Cancer Paternal Grandmother        colon   Cancer Paternal Grandfather        lung   COPD Paternal Grandfather    Depression Daughter    Diabetes Daughter    Obesity Daughter    Social History: Tawni lives in a house built 10 years ago, no water damage, carpet in bedroom, electric heating, central AC, indoor 2 dogs and 2 [place] pigs, no cockroaches, not using dust mite protection bedding or pillows, no smoke exposure.  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  [place]  No personal smoke history.   ROS:  All other systems negative except as noted per HPI.  Objective:  Blood pressure 136/84, pulse (!) 105, temperature 97.7 F (36.5 C), temperature source Temporal, resp. rate 17, height 5\' 6"  (1.676 m), weight 272 lb 3.2 oz (123.5 kg), SpO2 97 %. Body mass index is 43.93 kg/m. Physical Exam:  General Appearance:  Alert, cooperative, no distress, appears stated age  Head:  Normocephalic, without obvious abnormality, atraumatic  Eyes:  Conjunctiva clear, EOM's intact  Nose: Nares normal, hypertrophic turbinates, normal mucosa, no visible anterior polyps, and septum midline  Throat: Lips,  tongue normal; teeth and gums normal, normal posterior oropharynx, tonsils 2+, and + cobblestoning  Neck: Supple, symmetrical  Lungs:   clear to auscultation bilaterally, Respirations unlabored, no coughing  Heart:  regular rate and rhythm and no murmur, Appears well perfused  Extremities: No edema  Skin: Skin color, texture, turgor normal, no rashes or lesions on visualized portions of skin  Neurologic: No gross deficits     Diagnostics: Labs today-see below  Skin Testing:  deferred [facility] insurance .  Assessment and Plan  Chronic Rhinitis: Seasonal and Perennial Allergic: - allergy testing today: deferred due to insurance 2018 testing Positive to grass pollen molds, dog epithelia, and dust mite   - Prevention:  - allergen avoidance when possible - consider allergy shots as Rake term control of your symptoms by teaching your immune system to be more tolerant of your allergy triggers  - RUSH and allergy injections codes given today  - Symptom control: - Continue Dymista 1spray in each nostril twice a day as needed for nasal congestion/itchy nose - Start Atrovent (Ipratropium Bromide) 1-2 sprays in each nostril up to 3 times a day as needed for runny nose/post nasal drip/drainage.   - Use less frequently if  airway gets too dry. - Continue Antihistamine: daily or daily as needed.  May use twice daily if needed -Options include Zyrtec (Cetirizine) 10mg , Claritin (Loratadine) 10mg , Allegra (Fexofenadine) 180mg , or Xyzal (Levocetirinze) 5mg  - Can be purchased over-the-counter if not covered by insurance.  Recurrent sinusitis:  - screening immune labs today  Dyshidrotic eczema:  Daily Care For Maintenance (daily and continue even once eczema controlled) - Use hypoallergenic hydrating ointment at least twice daily.  This must be done daily for control of flares. (Great options include Vaseline, CeraVe, Aquaphor, Aveeno, Cetaphil, VaniCream, etc) - Avoid detergents, soaps or lotions with fragrances/dyes - Limit showers/baths to 5 minutes and use luke warm water instead of hot, pat dry following baths, and apply moisturizer - can use steroid/non-steroid therapy creams as detailed below up to twice weekly for prevention of flares.  For Flares:(add this to maintenance therapy if needed for flares) First apply steroid/non-steroid treatment creams. Wait 5 minutes then apply moisturizer.  - if flares again, let us know and we can call something in for you  Follow up : February 12th at 1:30 PM-must stop antihistamines (dymista nasal spray and xyzal, etc) 3 days prior to visit It was a pleasure meeting you in clinic today! Thank you for allowing me to participate in your care.  Sigurd Sos, MD [facility]  This note in its entirety was forwarded to the Provider who requested this consultation.  Thank you for your kind referral. I appreciate the opportunity to take part in Johanne's care. Please do not hesitate to contact me with questions.  Sincerely,  Sigurd Sos, MD [facility] [place]

## 2022-10-31 ENCOUNTER — Ambulatory Visit (INDEPENDENT_AMBULATORY_CARE_PROVIDER_SITE_OTHER): Payer: No Typology Code available for payment source | Admitting: Internal Medicine

## 2022-10-31 ENCOUNTER — Encounter: Payer: Self-pay | Admitting: Internal Medicine

## 2022-10-31 VITALS — BP 136/84 | HR 105 | Temp 97.7°F | Resp 17 | Ht 66.0 in | Wt 272.2 lb

## 2022-10-31 DIAGNOSIS — L301 Dyshidrosis [pompholyx]: Secondary | ICD-10-CM

## 2022-10-31 DIAGNOSIS — J329 Chronic sinusitis, unspecified: Secondary | ICD-10-CM | POA: Diagnosis not present

## 2022-10-31 DIAGNOSIS — J3089 Other allergic rhinitis: Secondary | ICD-10-CM | POA: Diagnosis not present

## 2022-10-31 MED ORDER — IPRATROPIUM BROMIDE 0.03 % NA SOLN: 2.0000 | Freq: Three times a day (TID) | NASAL | 12 refills | Status: DC | PRN

## 2022-10-31 NOTE — Patient Instructions (Signed)
Chronic Rhinitis: Seasonal and Perennial Allergic: - allergy testing today: deferred due to insurance 2018 testing Positive to grass pollen molds, dog epithelia, and dust mite   - Prevention:  - allergen avoidance when possible - consider allergy shots as [place]lee term control of your symptoms by teaching your immune system to be more tolerant of your allergy triggers  - RUSH and allergy injections codes given today  - Symptom control: - Continue  Dymista  1spray in each nostril twice a day as needed for nasal congestion/itchy nose - Start Atrovent (Ipratropium Bromide) 1-2 sprays in each nostril up to 3 times a day as needed for runny nose/post nasal drip/drainage.   - Use less frequently if airway gets too dry. - Continue Antihistamine: daily or daily as needed.  May use twice daily if needed -Options include Zyrtec (Cetirizine) 10mg , Claritin (Loratadine) 10mg , Allegra (Fexofenadine) 180mg , or Xyzal (Levocetirinze) 5mg  - Can be purchased over-the-counter if not covered by insurance.  Recurrent sinusitis:  - screening immune labs today  Follow up : February 12th at 1:30 PM-must stop antihistamines (dymista nasal spray and xyzal, etc) 3 days prior to visit It was a pleasure meeting you in clinic today! Thank you for allowing me to participate in your care.  Sigurd Sos, MD [facility]  Reducing Pollen Exposure  The American Academy of Allergy, Asthma and Immunology suggests the following steps to reduce your exposure to pollen during allergy seasons.    Do not hang sheets or clothing out to dry; pollen may collect on these items. Do not mow lawns or spend time around freshly cut grass; mowing stirs up pollen. Keep windows closed at night.  Keep car windows closed while driving. Minimize morning activities outdoors, a time when pollen counts are usually at their highest. Stay indoors as much as possible when pollen counts or humidity is high and on windy days when  pollen tends to remain in the air longer. Use air conditioning when possible.  Many air conditioners have filters that trap the pollen spores. Use a HEPA room air filter to remove pollen form the indoor air you breathe. Control of Dog or Cat Allergen  Avoidance is the best way to manage a dog or cat allergy. If you have a dog or cat and are allergic to dog or cats, consider removing the dog or cat from the home. If you have a dog or cat but don't want to find it a new home, or if your family wants a pet even though someone in the household is allergic, here are some strategies that may help keep symptoms at bay:  Keep the pet out of your bedroom and restrict it to only a few rooms. Be advised that keeping the dog or cat in only one room will not limit the allergens to that room. Don't pet, hug or kiss the dog or cat; if you do, wash your hands with soap and water. High-efficiency particulate air (HEPA) cleaners run continuously in a bedroom or living room can reduce allergen levels over time. Regular use of a high-efficiency vacuum cleaner or a central vacuum can reduce allergen levels. Giving your dog or cat a bath at least once a week can reduce airborne allergen. DUST MITE AVOIDANCE MEASURES:  There are three main measures that need and can be taken to avoid house dust mites:  Reduce accumulation of dust in general -reduce furniture, clothing, carpeting, books, stuffed animals, especially in bedroom  Separate yourself from the  dust -use pillow and mattress encasements (can be found at stores such as Bed, Bath, and Beyond or online) -avoid direct exposure to air condition flow -use a HEPA filter device, especially in the bedroom; you can also use a HEPA filter vacuum cleaner -wipe dust with a moist towel instead of a dry towel or broom when cleaning  Decrease mites and/or their secretions -wash clothing and linen and stuffed animals at highest temperature possible, at least every 2  weeks -stuffed animals can also be placed in a bag and put in a freezer overnight  Despite the above measures, it is impossible to eliminate dust mites or their allergen completely from your home.  With the above measures the burden of mites in your home can be diminished, with the goal of minimizing your allergic symptoms.  Success will be reached only when implementing and using all means together.

## 2022-11-03 NOTE — Telephone Encounter (Signed)
Patient notified VIA phone of denial and no questions.  Dm/cma

## 2022-11-05 ENCOUNTER — Other Ambulatory Visit: Payer: Self-pay | Admitting: Nurse Practitioner

## 2022-11-06 ENCOUNTER — Encounter: Payer: Self-pay | Admitting: Nurse Practitioner

## 2022-11-07 ENCOUNTER — Other Ambulatory Visit: Payer: Self-pay | Admitting: Internal Medicine

## 2022-11-07 DIAGNOSIS — J329 Chronic sinusitis, unspecified: Secondary | ICD-10-CM

## 2022-11-07 LAB — CBC WITH DIFFERENTIAL
Basophils Absolute: 0 10*3/uL (ref 0.0–0.2)
Basos: 1 %
EOS (ABSOLUTE): 0.1 10*3/uL (ref 0.0–0.4)
Eos: 2 %
Hematocrit: 44.5 % (ref 34.0–46.6)
Hemoglobin: 14.8 g/dL (ref 11.1–15.9)
Immature Grans (Abs): 0 10*3/uL (ref 0.0–0.1)
Immature Granulocytes: 0 %
Lymphocytes Absolute: 1.4 10*3/uL (ref 0.7–3.1)
Lymphs: 23 %
MCH: 29.4 pg (ref 26.6–33.0)
MCHC: 33.3 g/dL (ref 31.5–35.7)
MCV: 89 fL (ref 79–97)
Monocytes Absolute: 0.3 10*3/uL (ref 0.1–0.9)
Monocytes: 6 %
Neutrophils Absolute: 4 10*3/uL (ref 1.4–7.0)
Neutrophils: 68 %
RBC: 5.03 x10E6/uL (ref 3.77–5.28)
RDW: 12.8 % (ref 11.7–15.4)
WBC: 5.9 10*3/uL (ref 3.4–10.8)

## 2022-11-07 LAB — STREP PNEUMONIAE 23 SEROTYPES IGG
Pneumo Ab Type 1*: 0.7 ug/mL — ABNORMAL LOW (ref >1.3)
Pneumo Ab Type 12 (12F)*: 0.2 ug/mL — ABNORMAL LOW (ref >1.3)
Pneumo Ab Type 14*: 0.3 ug/mL — ABNORMAL LOW (ref >1.3)
Pneumo Ab Type 17 (17F)*: 2.6 ug/mL (ref >1.3)
Pneumo Ab Type 19 (19F)*: 3.2 ug/mL (ref >1.3)
Pneumo Ab Type 2*: >20.7 ug/mL (ref >1.3)
Pneumo Ab Type 20*: 3.9 ug/mL (ref >1.3)
Pneumo Ab Type 22 (22F)*: 1.2 ug/mL — ABNORMAL LOW (ref >1.3)
Pneumo Ab Type 23 (23F)*: 1 ug/mL — ABNORMAL LOW (ref >1.3)
Pneumo Ab Type 26 (6B)*: 1.3 ug/mL — ABNORMAL LOW (ref >1.3)
Pneumo Ab Type 3*: 1.2 ug/mL — ABNORMAL LOW (ref >1.3)
Pneumo Ab Type 34 (10A)*: 1.7 ug/mL (ref >1.3)
Pneumo Ab Type 4*: 0.2 ug/mL — ABNORMAL LOW (ref >1.3)
Pneumo Ab Type 43 (11A)*: 2.3 ug/mL (ref >1.3)
Pneumo Ab Type 5*: 0.4 ug/mL — ABNORMAL LOW (ref >1.3)
Pneumo Ab Type 51 (7F)*: 0.3 ug/mL — ABNORMAL LOW (ref >1.3)
Pneumo Ab Type 54 (15B)*: 0.8 ug/mL — ABNORMAL LOW (ref >1.3)
Pneumo Ab Type 56 (18C)*: 0.1 ug/mL — ABNORMAL LOW (ref >1.3)
Pneumo Ab Type 57 (19A)*: 2.1 ug/mL (ref >1.3)
Pneumo Ab Type 68 (9V)*: 0.2 ug/mL — ABNORMAL LOW (ref >1.3)
Pneumo Ab Type 70 (33F)*: 1.5 ug/mL (ref >1.3)
Pneumo Ab Type 8*: 1 ug/mL — ABNORMAL LOW (ref >1.3)
Pneumo Ab Type 9 (9N)*: 0.2 ug/mL — ABNORMAL LOW (ref >1.3)

## 2022-11-07 LAB — IGG, IGA, IGM
IgA/Immunoglobulin A, Serum: 252 mg/dL (ref 87–352)
IgG (Immunoglobin G), Serum: 894 mg/dL (ref 586–1602)
IgM (Immunoglobulin M), Srm: 27 mg/dL (ref 26–217)

## 2022-11-07 LAB — DIPHTHERIA / TETANUS ANTIBODY PANEL
Diphtheria Ab: 0.62 IU/mL (ref <0.10)
Tetanus Ab, IgG: 0.5 IU/mL (ref <0.10)

## 2022-11-07 NOTE — Progress Notes (Unsigned)
Date of Service/Encounter:  11/10/22  Allergy testing appointment   Initial visit on 10/31/22, seen for seasonal and perennial allergic rhinitis, eczema, recurrent sinusitis.  Please see that note for additional details.  Today reports for allergy diagnostic testing:   DIAGNOSTICS:  Skin Testing: Environmental allergy panel. Adequate positive and negative controls Results discussed with patient/family.  Airborne Adult Perc - 11/10/22 1347     Time Antigen Placed 1347    Allergen Manufacturer Lavella Hammock    Location Back    Number of Test 58    Panel 1 Select    1. Control-Buffer 50% Glycerol Negative    2. Control-Histamine 1 mg/ml 3+    3. Albumin saline Negative    4. [place] 4+    5. [place] 3+    6. Johnson Negative    7. Starr Blue Negative    9. Perennial Rye 3+    10. Sweet Vernal 3+    11. Timothy Negative    12. Cocklebur Negative    13. Burweed Marshelder Negative    14. Ragweed, short Negative    15. Ragweed, Giant Negative    16. Plantain,  English Negative    17. Lamb's Quarters Negative    18. Sheep Sorrell Negative    19. Rough Pigweed Negative    20. Marsh Elder, Rough Negative    21. Mugwort, Common Negative    22. Ash mix Negative    23. Birch mix Negative    24. Beech American Negative    25. Box, Elder Negative    26. Cedar, red Negative    27. Cottonwood, [place] Negative    28. Elm mix Negative    29. Hickory 2+    30. Maple mix Negative    31. Oak, [place] mix 3+    32. Pecan Pollen Negative    33. Pine mix Negative    34. Sycamore Eastern Negative    35. Mustang Ridge, Black Pollen Negative    36. Alternaria alternata Negative    37. Cladosporium Herbarum Negative    38. Aspergillus mix Negative    39. Penicillium mix Negative    40. Bipolaris sorokiniana (Helminthosporium) Negative    41. Drechslera spicifera (Curvularia) Negative    42. Mucor plumbeus Negative    43. Fusarium moniliforme Negative    44. Aureobasidium pullulans (pullulara)  Negative    45. Rhizopus oryzae Negative    46. Botrytis cinera Negative    47. Epicoccum nigrum Negative    48. Phoma betae Negative    49. Candida Albicans Negative    50. Trichophyton mentagrophytes Negative    51. Mite, D Farinae  5,000 AU/ml Negative    52. Mite, D Pteronyssinus  5,000 AU/ml Negative    53. Cat Hair 10,000 BAU/ml Negative    54.  Dog Epithelia 2+    55. Mixed Feathers Negative    56. Horse Epithelia Negative    57. Cockroach, German Negative    58. Mouse Negative    59. Tobacco Leaf Negative    Comments n             Intradermal - 11/10/22 1414     Time Antigen Placed 1414    Allergen Manufacturer Lavella Hammock    Location Arm    Number of Test 11    Control Negative    Johnson 4+    Ragweed mix Negative    Weed mix Negative    Mold 1 Negative    Mold 2 2+  Mold 3 Negative    Mold 4 Negative    Cat Negative    Cockroach Negative    Mite mix Negative             Allergy testing results were read and interpreted by myself, documented by clinical staff.  Patient provided with copy of allergy testing along with avoidance measures when indicated.   Sigurd Sos, MD  [facility] [place]

## 2022-11-07 NOTE — Progress Notes (Signed)
Please let Kimberly Valentine know that her immune evaluation looks reassuring with exception of not having adequate coverage to strep pneumonia bacteria, likely because she has not had these vaccines.   Since she has [place], we can give her the pneumovax 23 in clinic and then repeat her titers in 6 weeks.  Alternatively, she can go to her PCP-thought this vaccine is phasing out and may be hard to find.  However, it is preferred for immune evaluation purposes.    Let me know which she chooses.  I will place orders now for repeat titers.

## 2022-11-09 ENCOUNTER — Other Ambulatory Visit: Payer: Self-pay | Admitting: Nurse Practitioner

## 2022-11-10 ENCOUNTER — Encounter: Payer: Self-pay | Admitting: Internal Medicine

## 2022-11-10 ENCOUNTER — Ambulatory Visit (INDEPENDENT_AMBULATORY_CARE_PROVIDER_SITE_OTHER): Payer: No Typology Code available for payment source | Admitting: Internal Medicine

## 2022-11-10 ENCOUNTER — Ambulatory Visit: Payer: No Typology Code available for payment source

## 2022-11-10 DIAGNOSIS — H1013 Acute atopic conjunctivitis, bilateral: Secondary | ICD-10-CM

## 2022-11-10 DIAGNOSIS — J3089 Other allergic rhinitis: Secondary | ICD-10-CM

## 2022-11-10 MED ORDER — EPINEPHRINE 0.3 MG/0.3ML IJ SOAJ: 0.3000 mg | INTRAMUSCULAR | 2 refills | Status: AC | PRN

## 2022-11-10 NOTE — Patient Instructions (Signed)
Seasonal and Perennial Allergic Rhinitis - allergy testing today: positive to grass pollen, tree pollen (oak and hickory) and dog;  Intradermal positive to Johnson grass, and mold mix 2 (indoor molds-penicillin and aspergillus)  2018 testing Positive to grass pollen molds, dog epithelia, and dust mite   - Prevention:  - allergen avoidance when possible - consider allergy shots as Macias term control of your symptoms by teaching your immune system to be more tolerant of your allergy triggers  - RUSH and allergy injections codes given today  - Symptom control: - Continue  Dymista  1spray in each nostril twice a day as needed for nasal congestion/itchy nose - Start Atrovent (Ipratropium Bromide) 1-2 sprays in each nostril up to 3 times a day as needed for runny nose/post nasal drip/drainage.   - Use less frequently if airway gets too dry. - Continue Antihistamine: daily or daily as needed.  May use twice daily if needed -Options include Zyrtec (Cetirizine) 63m, Claritin (Loratadine) 12m Allegra (Fexofenadine) 18040mor Xyzal (Levocetirinze) 5mg23mCan be purchased over-the-counter if not covered by insurance.  Recurrent sinusitis:  - screening immune labs reassuring except you do not have protection against strep pneumonia vaccine - recommend Pneumovax 23 vaccine (can return on Friday-2:30 PM) - then you will need to return in 6 weeks to get repeat titers drawn   Follow up : 4 months, sooner if needed It was a pleasure seeing you again in clinic today! Thank you for allowing me to participate in your care.  ErinSigurd Sos [facility]  Reducing Pollen Exposure  The American Academy of Allergy, Asthma and Immunology suggests the following steps to reduce your exposure to pollen during allergy seasons.    Do not hang sheets or clothing out to dry; pollen may collect on these items. Do not mow lawns or spend time around freshly cut grass; mowing stirs up  pollen. Keep windows closed at night.  Keep car windows closed while driving. Minimize morning activities outdoors, a time when pollen counts are usually at their highest. Stay indoors as much as possible when pollen counts or humidity is high and on windy days when pollen tends to remain in the air longer. Use air conditioning when possible.  Many air conditioners have filters that trap the pollen spores. Use a HEPA room air filter to remove pollen form the indoor air you breathe. Control of Dog or Cat Allergen  Avoidance is the best way to manage a dog or cat allergy. If you have a dog or cat and are allergic to dog or cats, consider removing the dog or cat from the home. If you have a dog or cat but don't want to find it a new home, or if your family wants a pet even though someone in the household is allergic, here are some strategies that may help keep symptoms at bay:  Keep the pet out of your bedroom and restrict it to only a few rooms. Be advised that keeping the dog or cat in only one room will not limit the allergens to that room. Don't pet, hug or kiss the dog or cat; if you do, wash your hands with soap and water. High-efficiency particulate air (HEPA) cleaners run continuously in a bedroom or living room can reduce allergen levels over time. Regular use of a high-efficiency vacuum cleaner or a central vacuum can reduce allergen levels. Giving your dog or cat a bath at least once a week can reduce  airborne allergen. DUST MITE AVOIDANCE MEASURES:  There are three main measures that need and can be taken to avoid house dust mites:  Reduce accumulation of dust in general -reduce furniture, clothing, carpeting, books, stuffed animals, especially in bedroom  Separate yourself from the dust -use pillow and mattress encasements (can be found at stores such as Bed, Bath, and Beyond or online) -avoid direct exposure to air condition flow -use a HEPA filter device, especially in the  bedroom; you can also use a HEPA filter vacuum cleaner -wipe dust with a moist towel instead of a dry towel or broom when cleaning  Decrease mites and/or their secretions -wash clothing and linen and stuffed animals at highest temperature possible, at least every 2 weeks -stuffed animals can also be placed in a bag and put in a freezer overnight  Despite the above measures, it is impossible to eliminate dust mites or their allergen completely from your home.  With the above measures the burden of mites in your home can be diminished, with the goal of minimizing your allergic symptoms.  Success will be reached only when implementing and using all means together. Control of Dog or Cat Allergen  Avoidance is the best way to manage a dog or cat allergy. If you have a dog or cat and are allergic to dog or cats, consider removing the dog or cat from the home. If you have a dog or cat but don't want to find it a new home, or if your family wants a pet even though someone in the household is allergic, here are some strategies that may help keep symptoms at bay:  Keep the pet out of your bedroom and restrict it to only a few rooms. Be advised that keeping the dog or cat in only one room will not limit the allergens to that room. Don't pet, hug or kiss the dog or cat; if you do, wash your hands with soap and water. High-efficiency particulate air (HEPA) cleaners run continuously in a bedroom or living room can reduce allergen levels over time. Regular use of a high-efficiency vacuum cleaner or a central vacuum can reduce allergen levels. Giving your dog or cat a bath at least once a week can reduce airborne allergen.  Control of Mold Allergen   Mold and fungi can grow on a variety of surfaces provided certain temperature and moisture conditions exist.  Outdoor molds grow on plants, decaying vegetation and soil.  The major outdoor mold, Alternaria and Cladosporium, are found in very high numbers during hot  and dry conditions.  Generally, a late Summer - Fall peak is seen for common outdoor fungal spores.  Rain will temporarily lower outdoor mold spore count, but counts rise rapidly when the rainy period ends.  The most important indoor molds are Aspergillus and Penicillium.  Dark, humid and poorly ventilated basements are ideal sites for mold growth.  The next most common sites of mold growth are the bathroom and the kitchen.  Indoor (Perennial) Mold Control   Maintain humidity below 50%. Clean washable surfaces with 5% bleach solution. Remove sources e.g. contaminated carpets.

## 2022-11-11 NOTE — Progress Notes (Signed)
Aeroallergen Immunotherapy   Ordering Provider: Dr. Sigurd Sos   Patient Details  Name: Asusena Boggess  MRN: [id number]  Date of Birth: 1976/05/22   Order 1 of 2   Vial Label: G-T-Dog   0.3 ml (Volume)  BAU Concentration -- 7 Grass Mix* 100,000 ([address] [place], [place], [place], [place], [place], [place], [place])  0.2 ml (Volume)  1:20 Concentration -- [place]  0.3 ml (Volume)  BAU Concentration -- [place] 10,000  0.2 ml (Volume)  1:20 Concentration -- [place]  0.2 ml (Volume)  1:10 Concentration -- [place]*  0.3 ml (Volume)  1:10 Concentration -- [place], [place] mix*  0.5 ml (Volume)  1:10 Concentration -- Dog Epithelia    2.0  ml Extract Subtotal  3.0  ml Diluent  5.0  ml Maintenance Total   Schedule:  RUSH  Silver Vial (1:1,000,000): RUSH  Blue Vial (1:100,000): RUSH  Yellow Vial (1:10,000): RUSH  Green Vial (1:1,000): Schedule B (6 doses)  Red Vial (1:100): Schedule A (10 doses)   Special Instructions: RUSH then B green, A red.

## 2022-11-11 NOTE — Progress Notes (Signed)
Aeroallergen Immunotherapy   Ordering Provider: Dr. Sigurd Sos   Patient Details  Name: Kimberly Valentine  MRN: [id number]  Date of Birth: [birth year]   Order 2 of 2   Vial Label: Mold   0.2 ml (Volume)  1:10 Concentration -- Aspergillus mix  0.2 ml (Volume)  1:10 Concentration -- Penicillium mix    0.4  ml Extract Subtotal  4.6  ml Diluent  5.0  ml Maintenance Total   Schedule:  RUSH  Silver Vial (1:1,000,000): RUSH  Blue Vial (1:100,000): RUSH  Yellow Vial (1:10,000): RUSH  Green Vial (1:1,000): Schedule B (6 doses)  Red Vial (1:100): Schedule A (10 doses)   Special Instructions: RUSH, then green B, red A

## 2022-11-11 NOTE — Progress Notes (Addendum)
VIALS EXP 11-12-23

## 2022-11-14 ENCOUNTER — Ambulatory Visit (INDEPENDENT_AMBULATORY_CARE_PROVIDER_SITE_OTHER): Payer: No Typology Code available for payment source

## 2022-11-14 DIAGNOSIS — Z23 Encounter for immunization: Secondary | ICD-10-CM | POA: Diagnosis not present

## 2022-11-14 DIAGNOSIS — J329 Chronic sinusitis, unspecified: Secondary | ICD-10-CM

## 2022-11-17 DIAGNOSIS — J3081 Allergic rhinitis due to animal (cat) (dog) hair and dander: Secondary | ICD-10-CM | POA: Diagnosis not present

## 2022-11-18 DIAGNOSIS — J302 Other seasonal allergic rhinitis: Secondary | ICD-10-CM | POA: Diagnosis not present

## 2022-11-19 NOTE — Telephone Encounter (Signed)
Per previous encounter, PA was denied because of the insurance company not receiving chart notes with an included BMI. Chart notes need to be submitted for appeal or reconsideration.    Please be advised we currently do not have a Pharmacist to review denials, therefore you will need to process appeals accordingly as needed. Thanks for your support at this time. E-appeal available on CMM ([id number])

## 2022-11-20 ENCOUNTER — Telehealth: Payer: Self-pay

## 2022-11-20 NOTE — Telephone Encounter (Signed)
I called patient to let her know that her Rx of Saxenda was approved.

## 2022-11-24 ENCOUNTER — Ambulatory Visit: Payer: No Typology Code available for payment source | Admitting: Nurse Practitioner

## 2022-11-25 ENCOUNTER — Other Ambulatory Visit (HOSPITAL_COMMUNITY): Payer: Self-pay

## 2022-12-02 ENCOUNTER — Other Ambulatory Visit: Payer: Self-pay

## 2022-12-02 MED ORDER — PREDNISONE 20 MG PO TABS: ORAL_TABLET | ORAL | 0 refills | Status: DC

## 2022-12-02 MED ORDER — FAMOTIDINE 20 MG PO TABS: ORAL_TABLET | ORAL | 0 refills | Status: DC

## 2022-12-03 NOTE — Progress Notes (Signed)
RAPID DESENSITIZATION Note  RE: Kimberly Valentine MRN: [id number] DOB: May 02, 1976 Date of Office Visit: 12/05/2022  Subjective:  Patient presents today for rapid desensitization.  Interval History: Patient has not been ill, she has taken all premedications as per protocol.  Recent/Current History: Pulmonary disease: no Cardiac disease: no Respiratory infection: no Rash: no Itch: no Swelling: no Cough: no Shortness of breath: no Runny/stuffy nose: no Itchy eyes: no Beta-blocker use: no  Patient/guardian was informed of the procedure with verbalized understanding of the risk of anaphylaxis. Consent has been signed.   Medication List:  Current Outpatient Medications  Medication Sig Dispense Refill   acetaZOLAMIDE ER (DIAMOX) 500 MG capsule      Azelastine-Fluticasone 137-50 MCG/ACT SUSP Place 1 spray into the nose every 12 (twelve) hours. 23 g 1   buPROPion (WELLBUTRIN XL) 150 MG 24 hr tablet TAKE 1 TABLET BY MOUTH EVERY DAY 90 tablet 1   cefaclor (CECLOR CD) 500 MG 12 hr tablet Take 1 tablet by mouth every 12 (twelve) hours. (Patient not taking: Reported on 10/31/2022)     cetirizine (ZYRTEC) 10 MG tablet Take by mouth.     Cyanocobalamin (VITAMIN B12 PO) Take by mouth.     Doxepin HCl 3 MG TABS Take 1 tablet (3 mg total) by mouth at bedtime as needed (sleep). 90 tablet 1   EPINEPHrine (EPIPEN 2-PAK) 0.3 mg/0.3 mL IJ SOAJ injection Inject 0.3 mg into the muscle as needed for anaphylaxis. Bring to your allergy injection appointments 2 each 2   famotidine (PEPCID) 20 MG tablet Take 1 tablet Thursday morning/evening and take 1 tablet Friday morning/evening. 4 tablet 0   fexofenadine (ALLEGRA) 180 MG tablet Take 1 tablet every day by oral route.     ibuprofen (ADVIL) 600 MG tablet      ipratropium (ATROVENT) 0.03 % nasal spray Place 2 sprays into both nostrils 3 (three) times daily as needed for rhinitis. 30 mL 12   levocetirizine (XYZAL) 5 MG tablet      Liraglutide -Weight Management  (SAXENDA) 18 MG/3ML SOPN Inject 0.6 mg into the skin daily for 7 days, THEN 1.2 mg daily for 7 days, THEN 1.8 mg daily for 7 days, THEN 2.4 mg daily for 7 days, THEN 3 mg daily. (Patient not taking: Reported on 10/31/2022) 56.5 mL 0   montelukast (SINGULAIR) 10 MG tablet TAKE 1 TABLET BY MOUTH EVERY DAY 90 tablet 1   olmesartan (BENICAR) 40 MG tablet TAKE 1 TABLET BY MOUTH EVERY DAY 90 tablet 1   omeprazole (PRILOSEC) 20 MG capsule Take 20 mg by mouth daily. (Patient not taking: Reported on 10/31/2022)     predniSONE (DELTASONE) 10 MG tablet Take 6 tablets today, then 5 tablets tomorrow, then decrease by 1 tablet every day until gone (Patient not taking: Reported on 10/31/2022) 21 tablet 0   predniSONE (DELTASONE) 20 MG tablet Take 2 tablets Thursday morning, 2 tablets Friday morning. 4 tablet 0   venlafaxine XR (EFFEXOR-XR) 75 MG 24 hr capsule TAKE 1 CAPSULE BY MOUTH DAILY WITH BREAKFAST. 90 capsule 1   VITAMIN D PO Take by mouth.     No current facility-administered medications for this visit.   Allergies: Allergies  Allergen Reactions   Septra [Sulfamethoxazole-Trimethoprim] Hives and Shortness Of Breath   Grass Pollen(K-O-R-T-Swt Vern)    Trazodone And Nefazodone Other (See Comments)    Light headed   I reviewed her past medical history, social history, family history, and environmental history and no significant changes have been  reported from her previous visit.  ROS: Negative except as per HPI.  Objective: There were no vitals taken for this visit. There is no height or weight on file to calculate BMI.   General Appearance:  Alert, cooperative, no distress, appears stated age  Head:  Normocephalic, without obvious abnormality, atraumatic  Eyes:  Conjunctiva clear, EOM's intact  Nose: Nares normal  Throat: Lips, tongue normal; teeth and gums normal, normal posterior oropharnyx  Neck: Supple, symmetrical  Lungs:   CTAB, Respirations unlabored, no coughing  Heart:  RRR, no murmur,  Appears well perfused  Extremities: No edema  Skin: Skin color, texture, turgor normal, no rashes or lesions on visualized portions of skin  Neurologic: No gross deficits     Diagnostics: PROCEDURES:  Patient received the following doses every hour: Step 1:  0.42ml - 1:1,000,000 dilution (silver vial) Step 2:  0.4ml - 1:1,000,000 dilution (silver vial) Step 3: 0.38ml - 1:100,000 dilution (blue vial)  Step 4: 0.52ml - 1:100,000 dilution (blue vial)  Step 5: 0.15ml - 1:10,000 dilution (gold vial) Step 6: 0.25ml - 1:10,000 dilution (gold vial) Step 7: 0.29ml - 1:10,000 dilution (gold vial) Step 8: 0.62ml - 1:10,000 dilution (gold vial)  Patient was observed for 1 hour after the last dose.   Procedure started at 8:44 AM Procedure ended at 2:50 PM   ASSESSMENT/PLAN:   Patient has tolerated the rapid desensitization protocol.  Next appointment: Start at 0.54ml of 1:1000 dilution (green vial) and build up per protocol.

## 2022-12-05 ENCOUNTER — Ambulatory Visit (INDEPENDENT_AMBULATORY_CARE_PROVIDER_SITE_OTHER): Payer: No Typology Code available for payment source | Admitting: Internal Medicine

## 2022-12-05 ENCOUNTER — Encounter: Payer: Self-pay | Admitting: Internal Medicine

## 2022-12-05 VITALS — BP 122/80 | HR 92 | Temp 98.0°F | Resp 18

## 2022-12-05 DIAGNOSIS — J3089 Other allergic rhinitis: Secondary | ICD-10-CM

## 2022-12-12 ENCOUNTER — Ambulatory Visit (INDEPENDENT_AMBULATORY_CARE_PROVIDER_SITE_OTHER): Payer: No Typology Code available for payment source

## 2022-12-12 DIAGNOSIS — J309 Allergic rhinitis, unspecified: Secondary | ICD-10-CM | POA: Diagnosis not present

## 2022-12-14 ENCOUNTER — Encounter: Payer: Self-pay | Admitting: Nurse Practitioner

## 2022-12-15 MED ORDER — ZEPBOUND 2.5 MG/0.5ML ~~LOC~~ SOAJ: 2.5000 mg | SUBCUTANEOUS | 1 refills | Status: DC

## 2022-12-15 MED ORDER — ESCITALOPRAM OXALATE 20 MG PO TABS: 30.0000 mg | ORAL_TABLET | Freq: Every day | ORAL | 1 refills | Status: DC

## 2022-12-20 ENCOUNTER — Telehealth: Payer: No Typology Code available for payment source | Admitting: Physician Assistant

## 2022-12-20 DIAGNOSIS — H1031 Unspecified acute conjunctivitis, right eye: Secondary | ICD-10-CM | POA: Diagnosis not present

## 2022-12-20 MED ORDER — OFLOXACIN 0.3 % OP SOLN: 1.0000 [drp] | Freq: Four times a day (QID) | OPHTHALMIC | 0 refills | Status: DC

## 2022-12-20 NOTE — Progress Notes (Signed)
E-Visit for [employer]   We are sorry that you are not feeling well.  Here is how we plan to help!  Based on what you have shared with me it looks like you have conjunctivitis.  Conjunctivitis is a common inflammatory or infectious condition of the eye that is often referred to as "pink eye".  In most cases it is contagious (viral or bacterial). However, not all conjunctivitis requires antibiotics (ex. Allergic).  We have made appropriate suggestions for you based upon your presentation.  I have prescribed Oflaxacin 1-2 drops 4 times a day times 5 days   Pink eye can be highly contagious.  It is typically spread through direct contact with secretions, or contaminated objects or surfaces that one may have touched.  Strict handwashing is suggested with soap and water is urged.  If not available, use alcohol based had sanitizer.  Avoid unnecessary touching of the eye.  If you wear contact lenses, you will need to refrain from wearing them until you see no white discharge from the eye for at least 24 hours after being on medication.  You should see symptom improvement in 1-2 days after starting the medication regimen.  Call us if symptoms are not improved in 1-2 days.  Home Care: Wash your hands often! Do not wear your contacts until you complete your treatment plan. Avoid sharing towels, bed linen, personal items with a person who has pink eye. See attention for anyone in your home with similar symptoms.  Get Help Right Away If: Your symptoms do not improve. You develop blurred or loss of vision. Your symptoms worsen (increased discharge, pain or redness)   Thank you for choosing an e-visit.  Your e-visit answers were reviewed by a board certified advanced clinical practitioner to complete your personal care plan. Depending upon the condition, your plan could have included both over the counter or prescription medications.  Please review your pharmacy choice. Make sure the pharmacy is open so  you can pick up prescription now. If there is a problem, you may contact your provider through [employer] and have the prescription routed to another pharmacy.  Your safety is important to [place]. If you have drug allergies check your prescription carefully.   For the next 24 hours you can use MyChart to ask questions about today's visit, request a non-urgent call back, or ask for a work or school excuse. You will get an email in the next two days asking about your experience. I hope that your e-visit has been valuable and will speed your recovery.  This appointment required 5-10 minutes of patient care (this includes precharting, chart review, review of results, face-to-face care, etc.).  Inda Coke PA-C

## 2022-12-24 ENCOUNTER — Ambulatory Visit (INDEPENDENT_AMBULATORY_CARE_PROVIDER_SITE_OTHER): Payer: No Typology Code available for payment source

## 2022-12-24 DIAGNOSIS — J309 Allergic rhinitis, unspecified: Secondary | ICD-10-CM

## 2022-12-27 ENCOUNTER — Other Ambulatory Visit: Payer: Self-pay | Admitting: Nurse Practitioner

## 2022-12-30 ENCOUNTER — Ambulatory Visit (INDEPENDENT_AMBULATORY_CARE_PROVIDER_SITE_OTHER): Payer: No Typology Code available for payment source

## 2022-12-30 DIAGNOSIS — J309 Allergic rhinitis, unspecified: Secondary | ICD-10-CM

## 2023-01-06 ENCOUNTER — Ambulatory Visit (INDEPENDENT_AMBULATORY_CARE_PROVIDER_SITE_OTHER): Payer: No Typology Code available for payment source

## 2023-01-06 DIAGNOSIS — J309 Allergic rhinitis, unspecified: Secondary | ICD-10-CM | POA: Diagnosis not present

## 2023-01-15 ENCOUNTER — Ambulatory Visit (INDEPENDENT_AMBULATORY_CARE_PROVIDER_SITE_OTHER): Payer: No Typology Code available for payment source

## 2023-01-15 DIAGNOSIS — J309 Allergic rhinitis, unspecified: Secondary | ICD-10-CM

## 2023-01-20 ENCOUNTER — Ambulatory Visit (INDEPENDENT_AMBULATORY_CARE_PROVIDER_SITE_OTHER): Payer: No Typology Code available for payment source

## 2023-01-20 DIAGNOSIS — J309 Allergic rhinitis, unspecified: Secondary | ICD-10-CM

## 2023-01-27 ENCOUNTER — Ambulatory Visit (INDEPENDENT_AMBULATORY_CARE_PROVIDER_SITE_OTHER): Payer: No Typology Code available for payment source

## 2023-01-27 DIAGNOSIS — J309 Allergic rhinitis, unspecified: Secondary | ICD-10-CM

## 2023-01-29 ENCOUNTER — Telehealth: Payer: Self-pay

## 2023-01-29 ENCOUNTER — Other Ambulatory Visit (HOSPITAL_COMMUNITY): Payer: Self-pay

## 2023-01-29 NOTE — Telephone Encounter (Signed)
Patient Advocate Encounter   Received notification from [employer] that prior authorization for Zepbound is required.   PA submitted on 01/29/2023 Key [id number] Status is pending

## 2023-02-03 ENCOUNTER — Ambulatory Visit (INDEPENDENT_AMBULATORY_CARE_PROVIDER_SITE_OTHER): Payer: No Typology Code available for payment source

## 2023-02-03 DIAGNOSIS — J309 Allergic rhinitis, unspecified: Secondary | ICD-10-CM | POA: Diagnosis not present

## 2023-02-10 ENCOUNTER — Ambulatory Visit (INDEPENDENT_AMBULATORY_CARE_PROVIDER_SITE_OTHER): Payer: No Typology Code available for payment source

## 2023-02-10 DIAGNOSIS — J309 Allergic rhinitis, unspecified: Secondary | ICD-10-CM

## 2023-02-17 ENCOUNTER — Ambulatory Visit (INDEPENDENT_AMBULATORY_CARE_PROVIDER_SITE_OTHER): Payer: No Typology Code available for payment source

## 2023-02-17 DIAGNOSIS — J309 Allergic rhinitis, unspecified: Secondary | ICD-10-CM | POA: Diagnosis not present

## 2023-02-24 ENCOUNTER — Ambulatory Visit (INDEPENDENT_AMBULATORY_CARE_PROVIDER_SITE_OTHER): Payer: No Typology Code available for payment source

## 2023-02-24 DIAGNOSIS — J309 Allergic rhinitis, unspecified: Secondary | ICD-10-CM | POA: Diagnosis not present

## 2023-02-25 IMAGING — DX DG ANKLE COMPLETE 3+V*L*
3 series · 3 of 3 positions shown · non-contrast
Comparison: None.

CLINICAL DATA: Acute LEFT ankle pain following fall today. Initial
encounter.

EXAM:
LEFT ANKLE COMPLETE - 3+ VIEW

[ankle ap]
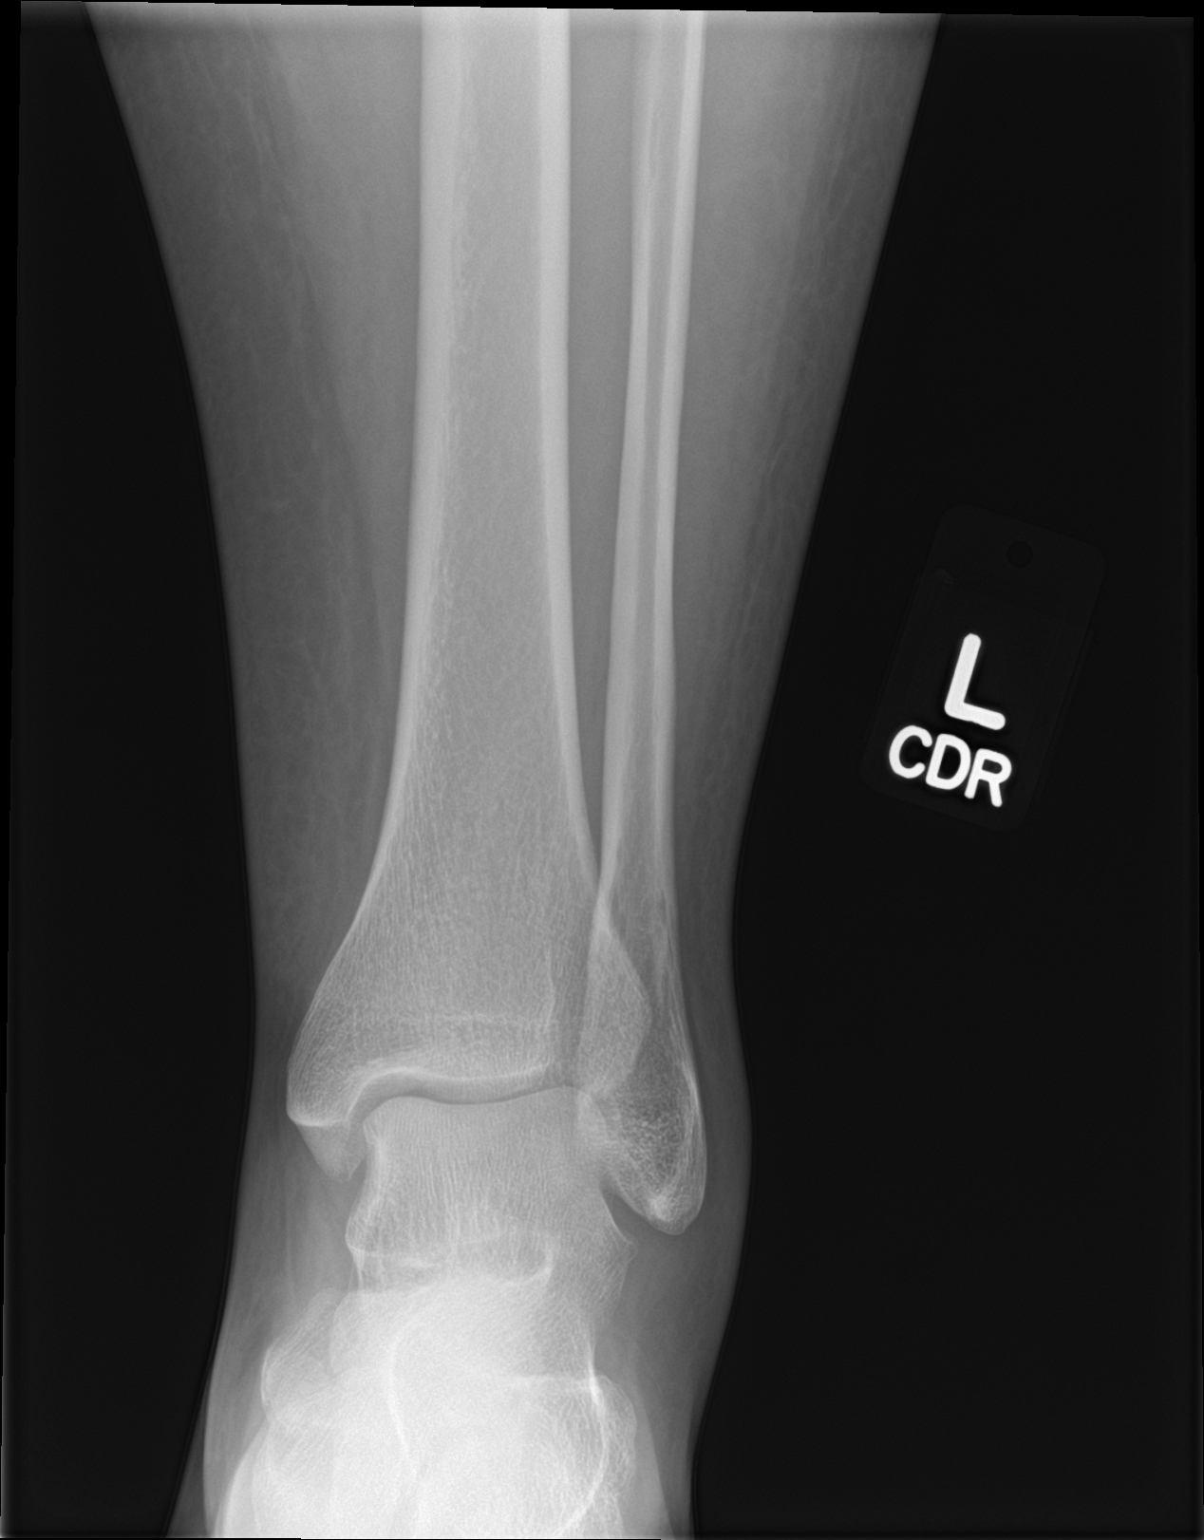

[ankle obl]
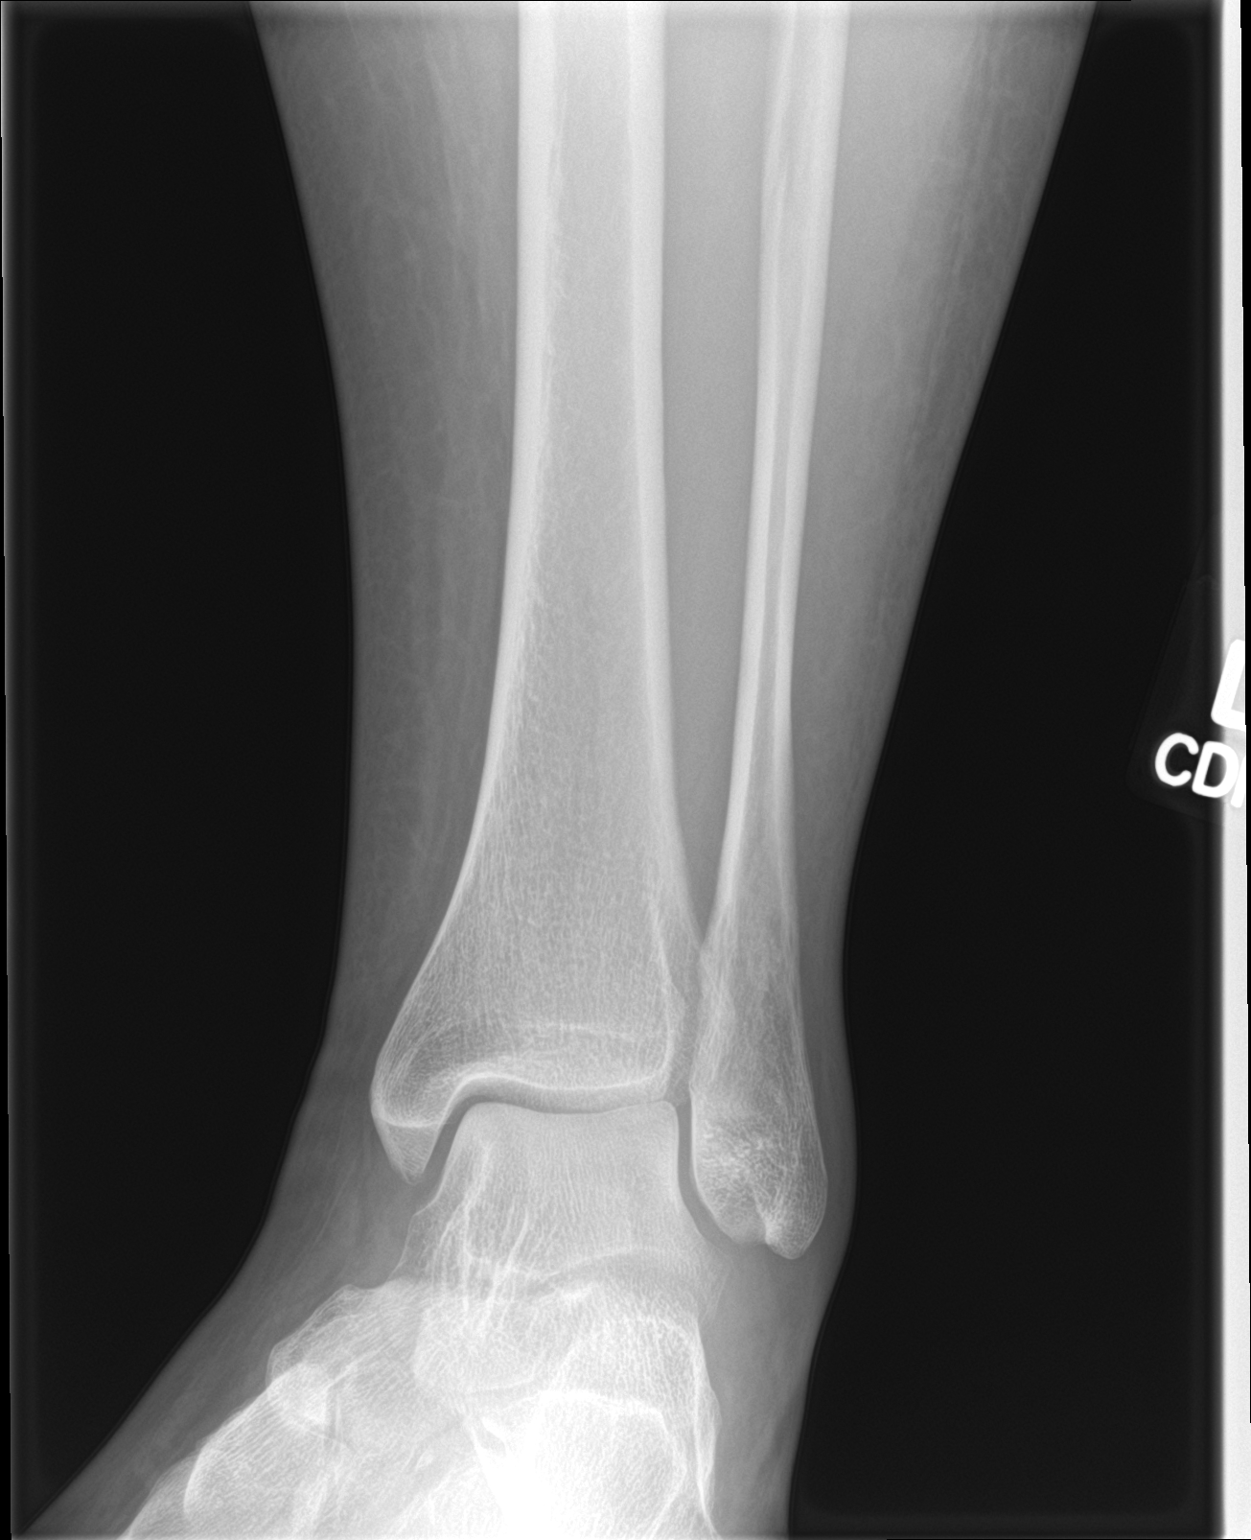

[ankle lat]
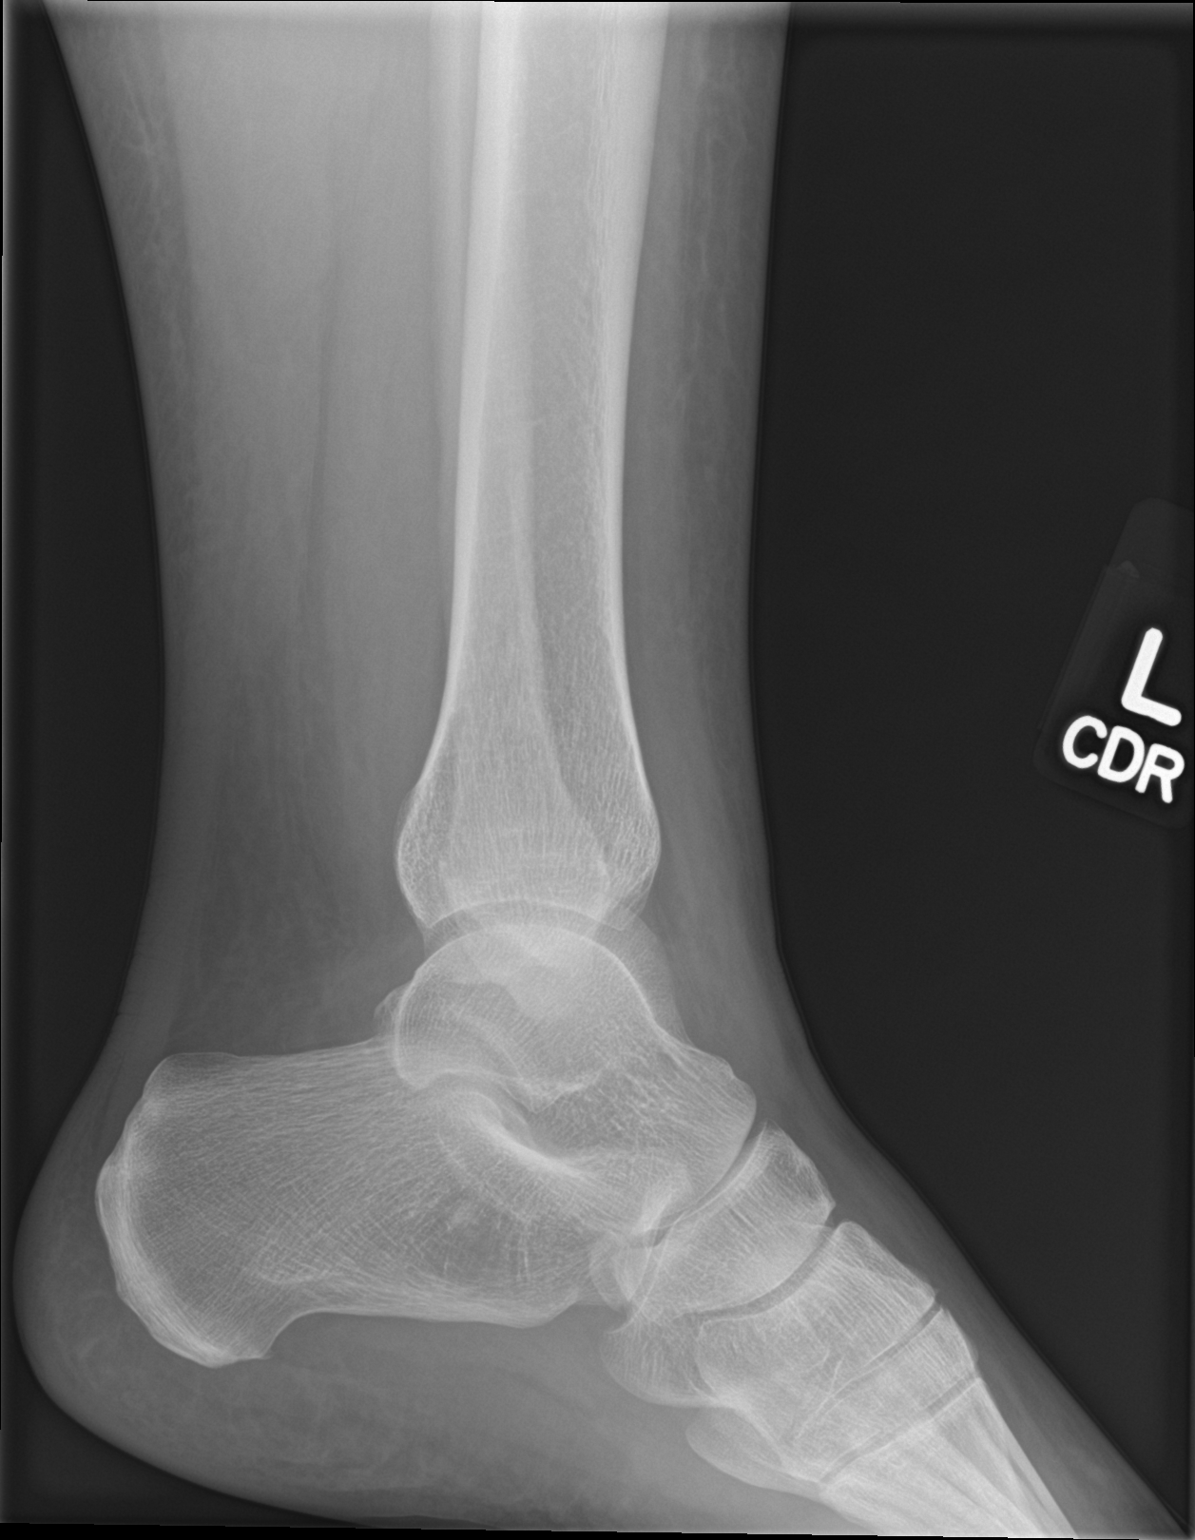

[3 of 3 positions shown; findings below may reference images not displayed]

FINDINGS: There is no evidence of acute fracture, subluxation or dislocation.

Mild LATERAL soft tissue swelling is noted.

No focal bony lesions are present.
IMPRESSION: Mild LATERAL soft tissue swelling. No acute bony abnormality.

## 2023-02-27 ENCOUNTER — Ambulatory Visit: Payer: No Typology Code available for payment source | Admitting: Nurse Practitioner

## 2023-03-17 ENCOUNTER — Ambulatory Visit (INDEPENDENT_AMBULATORY_CARE_PROVIDER_SITE_OTHER): Payer: No Typology Code available for payment source

## 2023-03-17 DIAGNOSIS — J309 Allergic rhinitis, unspecified: Secondary | ICD-10-CM

## 2023-03-19 ENCOUNTER — Ambulatory Visit: Payer: No Typology Code available for payment source | Admitting: Nurse Practitioner

## 2023-03-31 ENCOUNTER — Ambulatory Visit (INDEPENDENT_AMBULATORY_CARE_PROVIDER_SITE_OTHER): Payer: No Typology Code available for payment source

## 2023-03-31 DIAGNOSIS — J309 Allergic rhinitis, unspecified: Secondary | ICD-10-CM

## 2023-04-01 ENCOUNTER — Ambulatory Visit: Payer: No Typology Code available for payment source | Admitting: Nurse Practitioner

## 2023-04-08 ENCOUNTER — Encounter: Payer: Self-pay | Admitting: Nurse Practitioner

## 2023-04-08 ENCOUNTER — Ambulatory Visit: Payer: No Typology Code available for payment source | Admitting: Nurse Practitioner

## 2023-04-08 VITALS — BP 124/88 | HR 87 | Temp 98.2°F | Ht 66.0 in | Wt 262.4 lb

## 2023-04-08 DIAGNOSIS — E559 Vitamin D deficiency, unspecified: Secondary | ICD-10-CM

## 2023-04-08 DIAGNOSIS — E538 Deficiency of other specified B group vitamins: Secondary | ICD-10-CM | POA: Diagnosis not present

## 2023-04-08 DIAGNOSIS — R5382 Chronic fatigue, unspecified: Secondary | ICD-10-CM | POA: Diagnosis not present

## 2023-04-08 DIAGNOSIS — F32A Depression, unspecified: Secondary | ICD-10-CM

## 2023-04-08 DIAGNOSIS — I10 Essential (primary) hypertension: Secondary | ICD-10-CM

## 2023-04-08 DIAGNOSIS — Z6841 Body Mass Index (BMI) 40.0 and over, adult: Secondary | ICD-10-CM

## 2023-04-08 DIAGNOSIS — J3089 Other allergic rhinitis: Secondary | ICD-10-CM

## 2023-04-08 DIAGNOSIS — F5101 Primary insomnia: Secondary | ICD-10-CM

## 2023-04-08 DIAGNOSIS — R5383 Other fatigue: Secondary | ICD-10-CM | POA: Insufficient documentation

## 2023-04-08 DIAGNOSIS — E782 Mixed hyperlipidemia: Secondary | ICD-10-CM

## 2023-04-08 DIAGNOSIS — F419 Anxiety disorder, unspecified: Secondary | ICD-10-CM | POA: Diagnosis not present

## 2023-04-08 LAB — CBC WITH DIFFERENTIAL/PLATELET
Basophils Absolute: 0 10*3/uL (ref 0.0–0.1)
Basophils Relative: 0.9 % (ref 0.0–3.0)
Eosinophils Absolute: 0.1 10*3/uL (ref 0.0–0.7)
Eosinophils Relative: 1.6 % (ref 0.0–5.0)
HCT: 44 % (ref 36.0–46.0)
Hemoglobin: 14.8 g/dL (ref 12.0–15.0)
Lymphocytes Relative: 28.3 % (ref 12.0–46.0)
Lymphs Abs: 1.4 10*3/uL (ref 0.7–4.0)
MCHC: 33.7 g/dL (ref 30.0–36.0)
MCV: 85.5 fl (ref 78.0–100.0)
Monocytes Absolute: 0.3 10*3/uL (ref 0.1–1.0)
Monocytes Relative: 6.4 % (ref 3.0–12.0)
Neutro Abs: 3.1 10*3/uL (ref 1.4–7.7)
Neutrophils Relative %: 62.8 % (ref 43.0–77.0)
Platelets: 229 10*3/uL (ref 150.0–400.0)
RBC: 5.15 Mil/uL — ABNORMAL HIGH (ref 3.87–5.11)
RDW: 13.2 % (ref 11.5–15.5)
WBC: 4.9 10*3/uL (ref 4.0–10.5)

## 2023-04-08 LAB — COMPREHENSIVE METABOLIC PANEL
ALT: 18 U/L (ref 0–35)
AST: 15 U/L (ref 0–37)
Albumin: 4.2 g/dL (ref 3.5–5.2)
Alkaline Phosphatase: 72 U/L (ref 39–117)
BUN: 10 mg/dL (ref 6–23)
CO2: 30 mEq/L (ref 19–32)
Calcium: 9.2 mg/dL (ref 8.4–10.5)
Chloride: 100 mEq/L (ref 96–112)
Creatinine, Ser: 1.05 mg/dL (ref 0.40–1.20)
GFR: 62.54 mL/min (ref >60.00)
Glucose, Bld: 86 mg/dL (ref 70–99)
Potassium: 4.1 mEq/L (ref 3.5–5.1)
Sodium: 138 mEq/L (ref 135–145)
Total Bilirubin: 0.7 mg/dL (ref 0.2–1.2)
Total Protein: 6.6 g/dL (ref 6.0–8.3)

## 2023-04-08 LAB — VITAMIN B12: Vitamin B-12: >1500 pg/mL — ABNORMAL HIGH (ref 211–911)

## 2023-04-08 LAB — LIPID PANEL
Cholesterol: 198 mg/dL (ref 0–200)
HDL: 49 mg/dL (ref >39.00)
LDL Cholesterol: 121 mg/dL — ABNORMAL HIGH (ref 0–99)
NonHDL: 148.54
Total CHOL/HDL Ratio: 4
Triglycerides: 139 mg/dL (ref 0.0–149.0)
VLDL: 27.8 mg/dL (ref 0.0–40.0)

## 2023-04-08 LAB — TSH: TSH: 1.68 u[IU]/mL (ref 0.35–5.50)

## 2023-04-08 LAB — VITAMIN D 25 HYDROXY (VIT D DEFICIENCY, FRACTURES): VITD: 56.1 ng/mL (ref 30.00–100.00)

## 2023-04-08 MED ORDER — ZEPBOUND 5 MG/0.5ML ~~LOC~~ SOAJ: 5.0000 mg | SUBCUTANEOUS | 1 refills | Status: DC

## 2023-04-08 MED ORDER — OMEPRAZOLE 20 MG PO CPDR: 20.0000 mg | DELAYED_RELEASE_CAPSULE | Freq: Every day | ORAL | 3 refills | Status: DC

## 2023-04-08 MED ORDER — OLMESARTAN MEDOXOMIL 40 MG PO TABS: 40.0000 mg | ORAL_TABLET | Freq: Every day | ORAL | 1 refills | Status: DC

## 2023-04-08 MED ORDER — MONTELUKAST SODIUM 10 MG PO TABS: 10.0000 mg | ORAL_TABLET | Freq: Every day | ORAL | 1 refills | Status: DC

## 2023-04-08 MED ORDER — BUPROPION HCL ER (XL) 300 MG PO TB24: 300.0000 mg | ORAL_TABLET | Freq: Every day | ORAL | 1 refills | Status: DC

## 2023-04-08 NOTE — Assessment & Plan Note (Signed)
BMI 42.3. She has lost 10 pounds since her last visit. Congratulated her on this. Will increase zepbound to 5mg  injection weekly. Continue with nutrition changes. Encourage fluids, can take stool softener OTC as needed. Follow-up in 3 months.

## 2023-04-08 NOTE — Assessment & Plan Note (Signed)
Chronic, stable. Continue doxepin 3mg  at bedtime as needed for sleep.

## 2023-04-08 NOTE — Patient Instructions (Signed)
It was great to see you!  We are checking your labs today and will let you know the results via mychart/phone.   I have increased your wellbutrin to 300mg  daily. You can take 2 of your current tablets until you get your new bottle, then go down 1 tablet daily.   I have placed a referral to sleep medicine to discuss a sleep study.   Increase your zepbound to 5 mg injection weekly.   Let's follow-up in 3 months, sooner if you have concerns.  If a referral was placed today, you will be contacted for an appointment. Please note that routine referrals can sometimes take up to 3-4 weeks to process. Please call our office if you haven't heard anything after this time frame.  Take care,  Rodman Pickle, NP

## 2023-04-08 NOTE — Assessment & Plan Note (Signed)
Chronic, improving. She is following with an allergist and started getting allergy injections again. She is also taking montelukast 10mg  daily, and alternating zyrtec and xyzal.

## 2023-04-08 NOTE — Assessment & Plan Note (Signed)
Chronic, not controlled. She is still having fatigue and not wanting to get up and do things. Will continue lexapro 30mg  daily and increase wellbutrin to 300mg  daily. Follow-up in 3 months or sooner with concerns.

## 2023-04-08 NOTE — Assessment & Plan Note (Signed)
Chronic fatigue. Will check CMP, CBC, TSH, vitamin D and B12 today. Encourage regular sleep and exercise. Will place referral to sleep medicine for possible sleep study.

## 2023-04-08 NOTE — Assessment & Plan Note (Signed)
Chronic, stable. Check lipid panel and treat based on results.

## 2023-04-08 NOTE — Assessment & Plan Note (Signed)
She is currently taking an over the counter vitamin D supplement. Check vitamin D levels and adjust regimen based on results.

## 2023-04-08 NOTE — Progress Notes (Signed)
Established Patient Office Visit  Subjective   Patient ID: Kimberly Valentine, female    DOB: November 28, 1975  Age: 47 y.o. MRN: [id number]  Chief Complaint  Patient presents with   Anxiety and Depression    Follow up, no concerns    HPI  Kimberly Valentine is here to follow-up on depression and anxiety along with weight management.   She states that her depression and anxiety are stable. She did not tolerate the venlafaxine and is taking lexapro 30mg  daily. She is also taking wellbutrin XL 150mg  daily. She is still feeling fatigued, even though she is getting more sleep. She feels like the fatigue has worsened over the past few months.   She also started zepbound injections and has been watching what she is eating. She has not been exercising regularly. She does have some constipation and is taking over the counter stool softeners. She denies chest pain and shortness of breath.      04/08/2023    8:11 AM 10/14/2022   10:05 AM 04/17/2022   10:11 AM  Depression screen PHQ 2/9  Decreased Interest 1 2 2   Down, Depressed, Hopeless 1 3 0  PHQ - 2 Score 2 5 2   Altered sleeping 1 2 3   Tired, decreased energy 3 3 3   Change in appetite 0 2 2  Feeling bad or failure about yourself  2 1 1   Trouble concentrating 1 1 1   Moving slowly or fidgety/restless 0 0 0  Suicidal thoughts 0 1 1  PHQ-9 Score 9 15 13   Difficult doing work/chores Somewhat difficult Very difficult Somewhat difficult      04/08/2023    8:12 AM 10/14/2022   10:05 AM 04/17/2022   10:12 AM  GAD 7 : Generalized Anxiety Score  Nervous, Anxious, on Edge 0 1 1  Control/stop worrying 0 1 0  Worry too much - different things 0 1 2  Trouble relaxing 0 2 3  Restless 0 1 0  Easily annoyed or irritable 0 2 1  Afraid - awful might happen 0 0 0  Total GAD 7 Score 0 8 7  Anxiety Difficulty  Somewhat difficult Somewhat difficult      ROS See pertinent positives and negatives per HPI.    Objective:     BP 124/88 (BP Location: Left Arm)    Pulse 87   Temp 98.2 F (36.8 C)   Ht 5\' 6"  (1.676 m)   Wt 262 lb 6.4 oz (119 kg)   SpO2 97%   BMI 42.35 kg/m  BP Readings from Last 3 Encounters:  04/08/23 124/88  12/05/22 122/80  10/31/22 136/84   Wt Readings from Last 3 Encounters:  04/08/23 262 lb 6.4 oz (119 kg)  10/31/22 272 lb 3.2 oz (123.5 kg)  10/14/22 273 lb 12.8 oz (124.2 kg)      Physical Exam Vitals and nursing note reviewed.  Constitutional:      General: She is not in acute distress.    Appearance: Normal appearance.  HENT:     Head: Normocephalic.  Eyes:     Conjunctiva/sclera: Conjunctivae normal.  Cardiovascular:     Rate and Rhythm: Normal rate and regular rhythm.     Pulses: Normal pulses.     Heart sounds: Normal heart sounds.  Pulmonary:     Effort: Pulmonary effort is normal.     Breath sounds: Normal breath sounds.  Musculoskeletal:     Cervical back: Normal range of motion.  Skin:    General: Skin  is warm.  Neurological:     General: No focal deficit present.     Mental Status: She is alert and oriented to person, place, and time.  Psychiatric:        Mood and Affect: Mood normal.        Behavior: Behavior normal.        Thought Content: Thought content normal.        Judgment: Judgment normal.    The 10-year ASCVD risk score (Arnett DK, et al., 2019) is: 1.6%    Assessment & Plan:   Problem List Items Addressed This Visit       Cardiovascular and Mediastinum   Primary hypertension    Chronic, stable. Continue olmesartan 40mg  daily. Check CMP, CBC, lipid panel today. Refill sent to the pharmacy. Follow-up in 6 months.       Relevant Medications   olmesartan (BENICAR) 40 MG tablet   Other Relevant Orders   CBC with Differential/Platelet (Completed)   Comprehensive metabolic panel (Completed)   Lipid panel (Completed)     Respiratory   Perennial and seasonal allergic rhinitis    Chronic, improving. She is following with an allergist and started getting allergy injections  again. She is also taking montelukast 10mg  daily, and alternating zyrtec and xyzal.         Other   Anxiety and depression    Chronic, not controlled. She is still having fatigue and not wanting to get up and do things. Will continue lexapro 30mg  daily and increase wellbutrin to 300mg  daily. Follow-up in 3 months or sooner with concerns.       Relevant Medications   buPROPion (WELLBUTRIN XL) 300 MG 24 hr tablet   Mixed hyperlipidemia    Chronic, stable. Check lipid panel and treat based on results.       Relevant Medications   olmesartan (BENICAR) 40 MG tablet   Vitamin D deficiency    She is currently taking an over the counter vitamin D supplement. Check vitamin D levels and adjust regimen based on results.       Relevant Orders   VITAMIN D 25 Hydroxy (Vit-D Deficiency, Fractures) (Completed)   Vitamin B12 deficiency    She has a history of B12 deficiency and is currently taking a supplement.  We will check vitamin B12 levels today.      Relevant Orders   Vitamin B12 (Completed)   Insomnia    Chronic, stable. Continue doxepin 3mg  at bedtime as needed for sleep.       Morbid obesity (HCC)    BMI 42.3. She has lost 10 pounds since her last visit. Congratulated her on this. Will increase zepbound to 5mg  injection weekly. Continue with nutrition changes. Encourage fluids, can take stool softener OTC as needed. Follow-up in 3 months.       Relevant Medications   tirzepatide (ZEPBOUND) 5 MG/0.5ML Pen   Chronic fatigue - Primary    Chronic fatigue. Will check CMP, CBC, TSH, vitamin D and B12 today. Encourage regular sleep and exercise. Will place referral to sleep medicine for possible sleep study.       Relevant Orders   CBC with Differential/Platelet (Completed)   Comprehensive metabolic panel (Completed)   VITAMIN D 25 Hydroxy (Vit-D Deficiency, Fractures) (Completed)   Vitamin B12 (Completed)   TSH (Completed)    Return in about 3 months (around 07/09/2023) for htn,  weight management .    Gerre Scull, NP

## 2023-04-08 NOTE — Assessment & Plan Note (Signed)
Chronic, stable. Continue olmesartan 40mg  daily. Check CMP, CBC, lipid panel today. Refill sent to the pharmacy. Follow-up in 6 months.

## 2023-04-08 NOTE — Assessment & Plan Note (Signed)
She has a history of B12 deficiency and is currently taking a supplement.  We will check vitamin B12 levels today. 

## 2023-04-11 ENCOUNTER — Other Ambulatory Visit: Payer: Self-pay | Admitting: Nurse Practitioner

## 2023-04-13 ENCOUNTER — Ambulatory Visit (INDEPENDENT_AMBULATORY_CARE_PROVIDER_SITE_OTHER): Payer: No Typology Code available for payment source | Admitting: *Deleted

## 2023-04-13 DIAGNOSIS — J309 Allergic rhinitis, unspecified: Secondary | ICD-10-CM | POA: Diagnosis not present

## 2023-04-24 ENCOUNTER — Ambulatory Visit (INDEPENDENT_AMBULATORY_CARE_PROVIDER_SITE_OTHER): Payer: No Typology Code available for payment source

## 2023-04-24 DIAGNOSIS — J309 Allergic rhinitis, unspecified: Secondary | ICD-10-CM

## 2023-05-05 ENCOUNTER — Ambulatory Visit (INDEPENDENT_AMBULATORY_CARE_PROVIDER_SITE_OTHER): Payer: No Typology Code available for payment source

## 2023-05-05 DIAGNOSIS — J309 Allergic rhinitis, unspecified: Secondary | ICD-10-CM | POA: Diagnosis not present

## 2023-05-08 ENCOUNTER — Other Ambulatory Visit: Payer: Self-pay

## 2023-05-08 ENCOUNTER — Emergency Department (HOSPITAL_BASED_OUTPATIENT_CLINIC_OR_DEPARTMENT_OTHER)
Admission: EM | Admit: 2023-05-08 | Discharge: 2023-05-08 | Disposition: A | Discharge status: Home / Self Care | Payer: No Typology Code available for payment source | Attending: Emergency Medicine | Admitting: Emergency Medicine

## 2023-05-08 DIAGNOSIS — R55 Syncope and collapse: Secondary | ICD-10-CM

## 2023-05-08 DIAGNOSIS — I10 Essential (primary) hypertension: Secondary | ICD-10-CM | POA: Diagnosis not present

## 2023-05-08 DIAGNOSIS — Z79899 Other long term (current) drug therapy: Secondary | ICD-10-CM | POA: Insufficient documentation

## 2023-05-08 LAB — CBC
HCT: 48.9 % — ABNORMAL HIGH (ref 36.0–46.0)
Hemoglobin: 17.1 g/dL — ABNORMAL HIGH (ref 12.0–15.0)
MCH: 29.2 pg (ref 26.0–34.0)
MCHC: 35 g/dL (ref 30.0–36.0)
MCV: 83.4 fL (ref 80.0–100.0)
Platelets: 245 10*3/uL (ref 150–400)
RBC: 5.86 MIL/uL — ABNORMAL HIGH (ref 3.87–5.11)
RDW: 12.6 % (ref 11.5–15.5)
WBC: 8.7 10*3/uL (ref 4.0–10.5)
nRBC: 0 % (ref 0.0–0.2)

## 2023-05-08 LAB — BASIC METABOLIC PANEL
Anion gap: 8 (ref 5–15)
BUN: 14 mg/dL (ref 6–20)
CO2: 25 mmol/L (ref 22–32)
Calcium: 7.9 mg/dL — ABNORMAL LOW (ref 8.9–10.3)
Chloride: 102 mmol/L (ref 98–111)
Creatinine, Ser: 1.09 mg/dL — ABNORMAL HIGH (ref 0.44–1.00)
GFR, Estimated: >60 mL/min (ref >60)
Glucose, Bld: 81 mg/dL (ref 70–99)
Potassium: 3.7 mmol/L (ref 3.5–5.1)
Sodium: 135 mmol/L (ref 135–145)

## 2023-05-08 MED ORDER — SODIUM CHLORIDE 0.9 % IV BOLUS
1000.0000 mL | Freq: Once | INTRAVENOUS | Status: AC
  Administered 2023-05-08: 1000 mL via INTRAVENOUS

## 2023-05-08 NOTE — ED Provider Notes (Signed)
[place] [facility] Provider Note   CSN: [id number] Arrival date & time: 05/08/23  1623     History  Chief Complaint  Patient presents with   Near Syncope   HPI Kimberly Valentine is a 47 y.o. female with history of hypertension presenting for near syncope. Patient was donating plasma today and felt lightheaded, dizzy and weak.  EMS was called at that time and transferred her here.  En route was noted to have a lower blood pressure and soft pulses.  States is the second time she has donated plasma this week.  States that each time they took a liter of plasma.  Reports that the first time she received 1/2 L of fluids. Today she did not receive any fluids.  Denies cough fever, nausea vomiting diarrhea.  Denies chest pain shortness of breath.   Near Syncope       Home Medications Prior to Admission medications   Medication Sig Start Date End Date Taking? Authorizing Provider  Azelastine-Fluticasone 137-50 MCG/ACT SUSP Place 1 spray into the nose every 12 (twelve) hours. 10/14/22   McElwee, Lauren A, NP  buPROPion (WELLBUTRIN XL) 300 MG 24 hr tablet Take 1 tablet (300 mg total) by mouth daily. 04/08/23   McElwee, Lauren A, NP  cetirizine (ZYRTEC) 10 MG tablet Take by mouth.    [provider]  Cyanocobalamin (VITAMIN B12 PO) Take by mouth.    [provider]  Doxepin HCl 3 MG TABS TAKE 1 TABLET (3 MG TOTAL) BY MOUTH AT BEDTIME AS NEEDED (SLEEP). 12/29/22   McElwee, Lauren A, NP  EPINEPHrine (EPIPEN 2-PAK) 0.3 mg/0.3 mL IJ SOAJ injection Inject 0.3 mg into the muscle as needed for anaphylaxis. Bring to your allergy injection appointments 11/10/22   Verlee Monte, MD  escitalopram (LEXAPRO) 20 MG tablet Take 1.5 tablets (30 mg total) by mouth daily. 12/15/22   McElwee, Lauren A, NP  fexofenadine (ALLEGRA) 180 MG tablet Take 1 tablet every day by oral route.    [provider]  fluticasone (FLONASE) 50 MCG/ACT nasal spray Place 1 spray  into both nostrils as needed. 07/19/19   [provider]  ibuprofen (ADVIL) 600 MG tablet     [provider]  ipratropium (ATROVENT) 0.03 % nasal spray Place 2 sprays into both nostrils 3 (three) times daily as needed for rhinitis. 10/31/22   Verlee Monte, MD  levocetirizine (XYZAL) 5 MG tablet     [provider]  montelukast (SINGULAIR) 10 MG tablet Take 1 tablet (10 mg total) by mouth daily. 04/08/23   McElwee, Lauren A, NP  olmesartan (BENICAR) 40 MG tablet Take 1 tablet (40 mg total) by mouth daily. 04/08/23   McElwee, Jake Church, NP  omeprazole (PRILOSEC) 20 MG capsule Take 1 capsule (20 mg total) by mouth daily. 04/08/23   McElwee, Lauren A, NP  tirzepatide (ZEPBOUND) 5 MG/0.5ML Pen Inject 5 mg into the skin once a week. 04/08/23   McElwee, Lauren A, NP  VITAMIN D PO Take by mouth.    [provider]      Allergies    Septra [sulfamethoxazole-trimethoprim], Grass pollen([id number]-swt vern), and Trazodone and nefazodone    Review of Systems   Review of Systems  Cardiovascular:  Positive for near-syncope.    Physical Exam   Vitals:   05/08/23 1730 05/08/23 1751  BP: 110/79 126/83  Pulse: 77 76  Resp:  18  Temp:  98.6 F (37 C)  SpO2: 99% 100%  CONSTITUTIONAL:  well-appearing, NAD NEURO:  GCS 15. Speech is goal oriented. No deficits appreciated to CN III-XII; symmetric eyebrow raise, no facial drooping, tongue midline. Patient has equal grip strength bilaterally with 5/5 strength against resistance in all major muscle groups bilaterally. Sensation to light touch intact. Patient moves extremities without ataxia. Normal finger-nose-finger. Patient ambulatory with steady gait. EYES:  eyes equal and reactive ENT/NECK:  Supple, no stridor, moist mucous membranes CARDIO:  Regular rate and rhythm, cap refill 2 to 3 seconds, radial pulses 1+ bilaterally PULM:  No respiratory distress, CTAB GI/GU:  non-distended, soft MSK/SPINE:  No gross deformities, no  edema, moves all extremities  SKIN:  pale, no rash, atraumatic, sluggish skin turgor  *Additional and/or pertinent findings included in MDM below  ED Results / Procedures / Treatments   Labs (all labs ordered are listed, but only abnormal results are displayed) Labs Reviewed  CBC - Abnormal; Notable for the following components:      Result Value   RBC 5.86 (*)    Hemoglobin 17.1 (*)    HCT 48.9 (*)    All other components within normal limits  BASIC METABOLIC PANEL - Abnormal; Notable for the following components:   Creatinine, Ser 1.09 (*)    Calcium 7.9 (*)    All other components within normal limits    EKG None  Radiology No results found.  Procedures Procedures    Medications Ordered in ED Medications  sodium chloride 0.9 % bolus 1,000 mL (0 mLs Intravenous Stopped 05/08/23 1753)  sodium chloride 0.9 % bolus 1,000 mL (0 mLs Intravenous Stopped 05/08/23 1815)    ED Course/ Medical Decision Making/ A&P                                 Medical Decision Making Amount and/or Complexity of Data Reviewed Labs: ordered.   Initial Impression and Ddx 47 year old well-appearing female present for near syncope in setting of recent plasma donations.  Exam revealed sluggish cap refill and skin turgor, radial pulses 1+ bilaterally.  DDx includes stroke, dehydration, PE, seizure, electrolyte derangement, orthostasis, sepsis. Patient PMH that increases complexity of ED encounter: hypertension  Interpretation of Diagnostics I independent reviewed and interpreted the labs as followed: Elevated creatinine   Patient Reassessment and Ultimate Disposition/Management Initially appeared hypovolemic given exam findings.  Treated with volume resuscitation.  Blood pressure improved.  Patient also stated that she was feeling much better and her lightheadedness and dizziness had improved significantly.  Able to ambulate around the unit without issue.  Labs were overall reassuring.  Suspect  the polycythemia was likely secondary to dehydration.  Workup does not suggest sepsis or cardiogenic hypotension.  Discussed plan return precautions.  Discharged home in good condition.  Patient management required discussion with the following services or consulting groups:  None  Complexity of Problems Addressed Acute complicated illness or Injury  Additional Data Reviewed and Analyzed Further history obtained from: Past medical history and medications listed in the EMR, Prior ED visit notes, and Recent discharge summary  Patient Encounter Risk Assessment None         Final Clinical Impression(s) / ED Diagnoses Final diagnoses:  Near syncope    Rx / DC Orders ED Discharge Orders     None         Gareth Eagle, PA-C 05/08/23 1854    Maia Plan, MD 05/12/23 [phone]

## 2023-05-08 NOTE — ED Notes (Signed)
Ambulated patient successfully on the ED floor. Patient did not verbalize feeling dizzy.

## 2023-05-08 NOTE — ED Triage Notes (Signed)
Donated plasma for the 2nd time this week. Had a near syncopal episode, felt dizzy and weak after.   Reports not receiving 500 fluid bolus after donating plasma.    CBG was 169

## 2023-05-08 NOTE — Discharge Instructions (Addendum)
Evaluation today was reassuring.  Suspect your symptoms were related to significant volume loss related to recent plasma donations.  Here vitals and your symptoms did improve after volume resuscitation.  If you develop dizziness, passout, develop chest pain or shortness of breath or any other concerning symptom please return emergency department further evaluation.

## 2023-05-11 DIAGNOSIS — J3081 Allergic rhinitis due to animal (cat) (dog) hair and dander: Secondary | ICD-10-CM

## 2023-05-11 NOTE — Progress Notes (Signed)
Vials Exp 05/10/24

## 2023-05-29 ENCOUNTER — Ambulatory Visit (INDEPENDENT_AMBULATORY_CARE_PROVIDER_SITE_OTHER): Payer: No Typology Code available for payment source

## 2023-05-29 DIAGNOSIS — J309 Allergic rhinitis, unspecified: Secondary | ICD-10-CM

## 2023-06-02 ENCOUNTER — Ambulatory Visit (INDEPENDENT_AMBULATORY_CARE_PROVIDER_SITE_OTHER): Payer: No Typology Code available for payment source

## 2023-06-02 DIAGNOSIS — J309 Allergic rhinitis, unspecified: Secondary | ICD-10-CM

## 2023-06-06 ENCOUNTER — Other Ambulatory Visit: Payer: Self-pay | Admitting: Nurse Practitioner

## 2023-06-08 NOTE — Telephone Encounter (Signed)
Requesting: ESCITALOPRAM 20 MG TABLET  Last Visit: 04/08/2023 Next Visit: 07/10/2023 Last Refill: 12/15/2022  Please Advise

## 2023-06-09 ENCOUNTER — Ambulatory Visit (INDEPENDENT_AMBULATORY_CARE_PROVIDER_SITE_OTHER): Payer: No Typology Code available for payment source

## 2023-06-09 DIAGNOSIS — J309 Allergic rhinitis, unspecified: Secondary | ICD-10-CM | POA: Diagnosis not present

## 2023-06-16 ENCOUNTER — Ambulatory Visit (INDEPENDENT_AMBULATORY_CARE_PROVIDER_SITE_OTHER): Payer: No Typology Code available for payment source

## 2023-06-16 DIAGNOSIS — J309 Allergic rhinitis, unspecified: Secondary | ICD-10-CM

## 2023-06-23 ENCOUNTER — Ambulatory Visit (INDEPENDENT_AMBULATORY_CARE_PROVIDER_SITE_OTHER): Payer: No Typology Code available for payment source

## 2023-06-23 ENCOUNTER — Other Ambulatory Visit: Payer: Self-pay

## 2023-06-23 DIAGNOSIS — J309 Allergic rhinitis, unspecified: Secondary | ICD-10-CM | POA: Diagnosis not present

## 2023-06-23 NOTE — Telephone Encounter (Signed)
Requesting: Zepbound 5mg /0.41ml Pen Last Visit: 04/08/2023 Next Visit: 07/10/2023 Last Refill: 04/08/2023  Please Advise

## 2023-06-24 MED ORDER — ZEPBOUND 5 MG/0.5ML ~~LOC~~ SOAJ: 5.0000 mg | SUBCUTANEOUS | 1 refills | Status: DC

## 2023-07-01 LAB — COMPREHENSIVE METABOLIC PANEL
Albumin: 4.1 (ref 3.5–5.0)
Calcium: 9 (ref 8.7–10.7)
Globulin: 2.8
eGFR: 57

## 2023-07-01 LAB — BASIC METABOLIC PANEL
BUN: 12 (ref 4–21)
CO2: 28 — AB (ref 13–22)
Chloride: 103 (ref 99–108)
Creatinine: 1.2 — AB (ref 0.5–1.1)
Glucose: 87
Potassium: 3.7 meq/L (ref 3.5–5.1)
Sodium: 139 (ref 137–147)

## 2023-07-01 LAB — HEPATIC FUNCTION PANEL
ALT: 16 U/L (ref 7–35)
AST: 13 (ref 13–35)
Alkaline Phosphatase: 73 (ref 25–125)

## 2023-07-01 LAB — CBC AND DIFFERENTIAL
HCT: 43 (ref 36–46)
Hemoglobin: 14.7 (ref 12.0–16.0)
Neutrophils Absolute: 3.95
Platelets: 208 10*3/uL (ref 150–400)
WBC: 5.6

## 2023-07-01 LAB — CBC: RBC: 5 (ref 3.87–5.11)

## 2023-07-03 ENCOUNTER — Other Ambulatory Visit: Payer: Self-pay | Admitting: Nurse Practitioner

## 2023-07-03 ENCOUNTER — Encounter: Payer: Self-pay | Admitting: Nurse Practitioner

## 2023-07-03 ENCOUNTER — Ambulatory Visit (INDEPENDENT_AMBULATORY_CARE_PROVIDER_SITE_OTHER): Payer: No Typology Code available for payment source

## 2023-07-03 ENCOUNTER — Ambulatory Visit (INDEPENDENT_AMBULATORY_CARE_PROVIDER_SITE_OTHER): Payer: No Typology Code available for payment source | Admitting: Nurse Practitioner

## 2023-07-03 VITALS — BP 120/84 | HR 88 | Temp 97.6°F | Ht 67.0 in | Wt 244.0 lb

## 2023-07-03 DIAGNOSIS — J309 Allergic rhinitis, unspecified: Secondary | ICD-10-CM | POA: Diagnosis not present

## 2023-07-03 DIAGNOSIS — R5382 Chronic fatigue, unspecified: Secondary | ICD-10-CM

## 2023-07-03 DIAGNOSIS — M25522 Pain in left elbow: Secondary | ICD-10-CM

## 2023-07-03 DIAGNOSIS — G43009 Migraine without aura, not intractable, without status migrainosus: Secondary | ICD-10-CM | POA: Insufficient documentation

## 2023-07-03 DIAGNOSIS — Z6838 Body mass index (BMI) 38.0-38.9, adult: Secondary | ICD-10-CM | POA: Diagnosis not present

## 2023-07-03 DIAGNOSIS — M25521 Pain in right elbow: Secondary | ICD-10-CM

## 2023-07-03 MED ORDER — ZEPBOUND 7.5 MG/0.5ML ~~LOC~~ SOAJ: 7.5000 mg | SUBCUTANEOUS | 1 refills | Status: DC

## 2023-07-03 MED ORDER — PREDNISONE 20 MG PO TABS: 40.0000 mg | ORAL_TABLET | Freq: Every day | ORAL | 0 refills | Status: DC

## 2023-07-03 NOTE — Progress Notes (Unsigned)
Established Patient Office Visit  Subjective   Patient ID: Kimberly Valentine, female    DOB: 1976-02-04  Age: 47 y.o. MRN: [id number]  Chief Complaint  Patient presents with   Chronic Fatigue    Follow up, concerns with headaches-went to ED, bilateral elbow pain, medication increase    HPI  Kimberly Valentine is here to follow-up on headache and weight management.   She states that she has not been feeling well and started having a headache. It got worse over the last few days and went to the ER on 07/01/23. She has been having nausea and dizziness in addition to the migraine. She got IV fluids in addition to pain medication. She states that the headache and dizziness got better. However, when she got home, she states the headache started again. She had a CT of her head which was normal. The headache is in the back of her head. She has been using excedrin and using ice. The dizziness has not come back. She has now started also having nasal congestion and a cough. She did a home covid test yesterday which was negative.   She also started having bilateral elbow pain. She states that if she picks up something, it causes pain in her lateral elbow. She states that if she doesn't move her arms, they don't hurt. She denies bruising, swelling, and injury. She states that it gets worse when she sleeps on her side at night. This has started about 6 weeks ago.   {History (Optional):23778}  ROS    Objective:     BP 120/84 (BP Location: Left Arm)   Pulse 88   Temp 97.6 F (36.4 C)   Ht 5\' 7"  (1.702 m)   Wt 244 lb (110.7 kg)   SpO2 98%   BMI 38.22 kg/m  BP Readings from Last 3 Encounters:  07/03/23 120/84  05/08/23 126/83  04/08/23 124/88   Wt Readings from Last 3 Encounters:  07/03/23 244 lb (110.7 kg)  05/08/23 255 lb (115.7 kg)  04/08/23 262 lb 6.4 oz (119 kg)      Physical Exam   Results for orders placed or performed in visit on 07/03/23  CBC and differential  Result Value Ref Range    Hemoglobin 14.7 12.0 - 16.0   HCT 43 36 - 46   Neutrophils Absolute 3.95    Platelets 208 150 - 400 K/uL   WBC 5.6   CBC  Result Value Ref Range   RBC 5.00 3.87 - 5.11  Basic metabolic panel  Result Value Ref Range   Glucose 87    BUN 12 4 - 21   CO2 28 (A) 13 - 22   Creatinine 1.2 (A) 0.5 - 1.1   Potassium 3.7 3.5 - 5.1 mEq/L   Sodium 139 137 - 147   Chloride 103 99 - 108  Comprehensive metabolic panel  Result Value Ref Range   Globulin 2.8    eGFR 57    Calcium 9.0 8.7 - 10.7   Albumin 4.1 3.5 - 5.0  Hepatic function panel  Result Value Ref Range   Alkaline Phosphatase 73 25 - 125   ALT 16 7 - 35 U/L   AST 13 13 - 35    {Labs (Optional):23779}  The 10-year ASCVD risk score (Arnett DK, et al., 2019) is: 1.5%    Assessment & Plan:   Problem List Items Addressed This Visit       Cardiovascular and Mediastinum   Migraine without aura  and without status migrainosus, not intractable - Primary   Relevant Medications   butalbital-acetaminophen-caffeine (FIORICET) 50-325-40 MG tablet     Other   Morbid obesity (HCC)   Relevant Medications   tirzepatide (ZEPBOUND) 7.5 MG/0.5ML Pen   Chronic fatigue   Relevant Orders   Ambulatory referral to Sleep Studies   Other Visit Diagnoses     Pain of both elbows           Return in about 3 months (around 10/03/2023) for weight management .    Gerre Scull, NP

## 2023-07-03 NOTE — Patient Instructions (Signed)
It was great to see you!  Start prednisone 2 tablets once a day in the morning with food   I have increased your zepbound to 7.5mg  injection weekly.   Let's follow-up in 3 months, sooner if you have concerns.  If a referral was placed today, you will be contacted for an appointment. Please note that routine referrals can sometimes take up to 3-4 weeks to process. Please call our office if you haven't heard anything after this time frame.  Take care,  Rodman Pickle, NP

## 2023-07-05 NOTE — Assessment & Plan Note (Signed)
Chronic, not controlled. She started having an increase in frequency in headaches. She went to the ER, received IV fluids and pain medication which helped until later in the day. Will start prednisone 40mg  daily x5. Continue fioricet as needed as prescribed by ER.

## 2023-07-05 NOTE — Assessment & Plan Note (Signed)
Referral to sleep medicine placed as discussed last visit.

## 2023-07-05 NOTE — Assessment & Plan Note (Signed)
BMI 38.2 associated with hyperlipidemia, anxiety, and depression. She is doing well on zepbound, not having any side effects. She has lost another 18 pounds since her last visit. Will increase zepbound to 7.5mg  injection weekly. Continue focus on nutrition and exercise.

## 2023-07-10 ENCOUNTER — Ambulatory Visit: Payer: No Typology Code available for payment source | Admitting: Nurse Practitioner

## 2023-07-17 ENCOUNTER — Ambulatory Visit (INDEPENDENT_AMBULATORY_CARE_PROVIDER_SITE_OTHER): Payer: No Typology Code available for payment source

## 2023-07-17 DIAGNOSIS — J309 Allergic rhinitis, unspecified: Secondary | ICD-10-CM | POA: Diagnosis not present

## 2023-07-20 ENCOUNTER — Ambulatory Visit: Payer: Self-pay

## 2023-08-12 DIAGNOSIS — J3081 Allergic rhinitis due to animal (cat) (dog) hair and dander: Secondary | ICD-10-CM | POA: Diagnosis not present

## 2023-08-12 NOTE — Progress Notes (Signed)
VIALS EXP 08-11-24

## 2023-08-13 ENCOUNTER — Encounter: Payer: Self-pay | Admitting: Neurology

## 2023-08-13 ENCOUNTER — Ambulatory Visit: Payer: No Typology Code available for payment source | Admitting: Neurology

## 2023-08-13 VITALS — BP 131/90 | HR 89 | Ht 67.0 in | Wt 233.2 lb

## 2023-08-13 DIAGNOSIS — R5382 Chronic fatigue, unspecified: Secondary | ICD-10-CM | POA: Diagnosis not present

## 2023-08-13 DIAGNOSIS — R5383 Other fatigue: Secondary | ICD-10-CM | POA: Diagnosis not present

## 2023-08-13 DIAGNOSIS — Z9189 Other specified personal risk factors, not elsewhere classified: Secondary | ICD-10-CM | POA: Diagnosis not present

## 2023-08-13 DIAGNOSIS — E66812 Obesity, class 2: Secondary | ICD-10-CM | POA: Insufficient documentation

## 2023-08-13 DIAGNOSIS — Z68.41 Body mass index (BMI) pediatric, 5th percentile to less than 85th percentile for age: Secondary | ICD-10-CM | POA: Insufficient documentation

## 2023-08-13 NOTE — Progress Notes (Signed)
SLEEP MEDICINE CLINIC    Provider:  Melvyn Novas, MD  Primary Care Physician:  Gerre Scull, NP [address] [place] [address]     Referring Provider: Gerre Scull, Np [address] [place],  [place] [address]          Chief Complaint according to patient   Patient presents with:     New Sleep Patient (Initial Visit) Fatigue, sleepiness.            HISTORY OF PRESENT ILLNESS:  Kimberly Valentine is a 47 y.o. female patient who is seen upon PCP/ NP referral on 08/13/2023  for an evaluation of  FATIGUE.  Chief concern according to patient :  In early October, I got lightheaded and likely dehydrated- Iv fluids were helpful, and I had a headache when I was seen in the ED in [place]-  I also got nauseated, with my headaches- and steroids resolved the headaches. "    I have the pleasure of seeing Kimberly Valentine on 08/13/23 for a possible sleep disorder. She has had no previous sleep study.   She lost 45 pounds over a period of 6 months on Zebound.   I reviewed Mrs. Matarazzo's medication list, also she had a CT of her head recently when she was evaluated for a headache spell that was normal, sometimes she is using Excedrin.  Intermittently seasonally she will have nasal congestion or coughing and phlegm but this is not the case today.  Her results from CBC from July 03, 2023 were normal, no anemia no large leukocytosis, electrolytes were in normal range, liver and kidney function were normal.     Sleep relevant medical history: Nocturia 1-2 times, wisdom teeth extraction. Allergies, sinus congestion, migraine for 15-20 years, infrequent.  Family medical /sleep history: Sister  and daughter on CPAP with OSA.   Social history:  Patient is working as Paramedic job, Marine scientist- daytime  and lives in a household with husband , 2 dogs and 2 [place] pigs.  Adult daughter is living in her own home, 7 grandchildren.   Tobacco use; none .  ETOH use ; 2/ year, Caffeine  intake in form of Coffee( /) Soda( 32 ounces a day) Tea ( /) or energy drinks Exercise: none regular.    Sleep habits are as follows: The patient's dinner time is between 5-6 PM. The patient goes to bed at 8.30 PM and is asleep by 9.30 PM ,continues to sleep for a total of 6  hours, wakes for 1-2 bathroom breaks.  The bedroom is cool, quiet and dark. The preferred sleep position is lateral, with the support of 2-3 pillows.  Dreams are reportedly frequent/vivid. The patient wakes up spontaneously/ at 5.40 , 5.45  AM is the usual rise time. She reports not feeling refreshed or restored in AM, with symptoms of morning headaches( dull) , and residual fatigue. She is not woken by headaches.  Naps are taken infrequently, lasting from 2 to 3 hours, more refreshing  but interfering with nocturnal sleep.    Review of Systems: Out of a complete 14 system review, the patient complains of only the following symptoms, and all other reviewed systems are negative.:  Fatigue, sleepiness , but no reported snoring, fragmented sleep, Insomnia - chronic , but improved by sleep routines. Nocturia 1-2   How likely are you to doze in the following situations: 0 = not likely, 1 = slight chance, 2 = moderate chance, 3 = high  chance   Sitting and Reading? Watching Television? Sitting inactive in a public place (theater or meeting)? As a passenger in a car for an hour without a break? Lying down in the afternoon when circumstances permit? Sitting and talking to someone? Sitting quietly after lunch without alcohol? In a car, while stopped for a few minutes in traffic?   Total = 2/ 24 points   FSS endorsed at 36/ 63 points.   Social History   Socioeconomic History   Marital status: Married    Spouse name: Not on file   Number of children: Not on file   Years of education: 12 HS ,     Highest education level: Some college, no degree  Occupational History   Payroll office   Tobacco Use   Smoking status: Never     Passive exposure: Never   Smokeless tobacco: Never  Vaping Use   Vaping status: Never Used  Substance and Sexual Activity   Alcohol use: Not Currently   Drug use: No   Sexual activity: Yes    Birth control/protection: Surgical  Other Topics Concern   Not on file  Social History Narrative   Not on file   Social Determinants of Health   Financial Resource Strain: Low Risk  (04/06/2023)   Overall Financial Resource Strain (CARDIA)    Difficulty of Paying Living Expenses: Not very hard  Food Insecurity: No Food Insecurity (04/06/2023)   Hunger Vital Sign    Worried About Running Out of Food in the Last Year: Never true    Ran Out of Food in the Last Year: Never true  Transportation Needs: No Transportation Needs (04/06/2023)   PRAPARE - Administrator, Civil Service (Medical): No    Lack of Transportation (Non-Medical): No  Physical Activity: Insufficiently Active (04/06/2023)   Exercise Vital Sign    Days of Exercise per Week: 1 day    Minutes of Exercise per Session: 10 min  Stress: Stress Concern Present (04/06/2023)   [employer] of Occupational Health - Occupational Stress Questionnaire    Feeling of Stress : To some extent  Social Connections: Unknown (07/01/2023)   Received from [facility]   Social Network    Social Network: Not on file  Recent Concern: Social Connections - Moderately Isolated (04/06/2023)   Social Connection and Isolation Panel [NHANES]    Frequency of Communication with Friends and Family: More than three times a week    Frequency of Social Gatherings with Friends and Family: Twice a week    Attends Religious Services: Never    Database administrator or Organizations: No    Attends Engineer, structural: Not on file    Marital Status: Married    Family History  Problem Relation Age of Onset   Alcohol abuse Mother    Depression Mother    Diabetes Mother    Hypertension Mother    Varicose Veins Mother    Arthritis Father     Cancer Father        prostate   COPD Father    Depression Father    Hearing loss Father    Hypertension Father    Miscarriages / Stillbirths Sister    Varicose Veins Sister    Allergic rhinitis Sister    Lupus Sister    Cancer Paternal Grandmother        colon   Cancer Paternal Grandfather        lung   COPD Paternal Grandfather  Depression Daughter    Diabetes Daughter    Obesity Daughter     Past Medical History:  Diagnosis Date   Allergy    Depression    Eczema    hands and feet   Headache    Hypertension    Recurrent upper respiratory infection (URI)     Past Surgical History:  Procedure Laterality Date   ABDOMINAL HYSTERECTOMY     CHOLECYSTECTOMY     EYE SURGERY  01/02/22   Lasik   FRACTURE SURGERY  12/19/21   Broke Rt Ankle   GALLBLADDER SURGERY       Current Outpatient Medications on File Prior to Visit  Medication Sig Dispense Refill   buPROPion (WELLBUTRIN XL) 300 MG 24 hr tablet Take 1 tablet (300 mg total) by mouth daily. 90 tablet 1   butalbital-acetaminophen-caffeine (FIORICET) 50-325-40 MG tablet Take 1 tablet by mouth every 4 (four) hours as needed.     Doxepin HCl 3 MG TABS TAKE 1 TABLET (3 MG TOTAL) BY MOUTH AT BEDTIME AS NEEDED (SLEEP). 90 tablet 1   EPINEPHrine (EPIPEN 2-PAK) 0.3 mg/0.3 mL IJ SOAJ injection Inject 0.3 mg into the muscle as needed for anaphylaxis. Bring to your allergy injection appointments 2 each 2   escitalopram (LEXAPRO) 20 MG tablet TAKE 1 AND 1/2 TABLETS DAILY BY MOUTH 135 tablet 1   fexofenadine (ALLEGRA) 180 MG tablet Take 1 tablet every day by oral route.     fluticasone (FLONASE) 50 MCG/ACT nasal spray Place 1 spray into both nostrils as needed.     ibuprofen (ADVIL) 600 MG tablet      montelukast (SINGULAIR) 10 MG tablet Take 1 tablet (10 mg total) by mouth daily. 90 tablet 1   olmesartan (BENICAR) 40 MG tablet Take 1 tablet (40 mg total) by mouth daily. 90 tablet 1   omeprazole (PRILOSEC) 20 MG capsule Take 1  capsule (20 mg total) by mouth daily. 90 capsule 3   tirzepatide (ZEPBOUND) 7.5 MG/0.5ML Pen Inject 7.5 mg into the skin once a week. 2 mL 1   VITAMIN D PO Take by mouth.     Azelastine-Fluticasone 137-50 MCG/ACT SUSP Place 1 spray into the nose every 12 (twelve) hours. (Patient not taking: Reported on 08/13/2023) 23 g 1   cetirizine (ZYRTEC) 10 MG tablet Take by mouth. (Patient not taking: Reported on 08/13/2023)     Cyanocobalamin (VITAMIN B12 PO) Take by mouth. (Patient not taking: Reported on 08/13/2023)     ipratropium (ATROVENT) 0.03 % nasal spray Place 2 sprays into both nostrils 3 (three) times daily as needed for rhinitis. (Patient not taking: Reported on 08/13/2023) 30 mL 12   levocetirizine (XYZAL) 5 MG tablet  (Patient not taking: Reported on 08/13/2023)     predniSONE (DELTASONE) 20 MG tablet Take 2 tablets (40 mg total) by mouth daily with breakfast. (Patient not taking: Reported on 08/13/2023) 10 tablet 0   No current facility-administered medications on file prior to visit.    Allergies  Allergen Reactions   Septra [Sulfamethoxazole-Trimethoprim] Hives and Shortness Of Breath   Grass Pollen(K-O-R-T-Swt Vern)    Trazodone And Nefazodone Other (See Comments)    Light headed     DIAGNOSTIC DATA (LABS, IMAGING, TESTING) - I reviewed patient records, labs, notes, testing and imaging myself where available.  Lab Results  Component Value Date   WBC 5.6 07/01/2023   HGB 14.7 07/01/2023   HCT 43 07/01/2023   MCV 83.4 05/08/2023   PLT 208 07/01/2023  Component Value Date/Time   NA 139 07/01/2023 0000   K 3.7 07/01/2023 0000   CL 103 07/01/2023 0000   CO2 28 (A) 07/01/2023 0000   GLUCOSE 81 05/08/2023 1715   BUN 12 07/01/2023 0000   CREATININE 1.2 (A) 07/01/2023 0000   CREATININE 1.09 (H) 05/08/2023 1715   CALCIUM 9.0 07/01/2023 0000   PROT 6.6 04/08/2023 0834   ALBUMIN 4.1 07/01/2023 0000   AST 13 07/01/2023 0000   ALT 16 07/01/2023 0000   ALKPHOS 73  07/01/2023 0000   BILITOT 0.7 04/08/2023 0834   GFRNONAA >60 05/08/2023 1715   Lab Results  Component Value Date   CHOL 198 04/08/2023   HDL 49.00 04/08/2023   LDLCALC 121 (H) 04/08/2023   TRIG 139.0 04/08/2023   CHOLHDL 4 04/08/2023   No results found for: "HGBA1C" Lab Results  Component Value Date   VITAMINB12 >1500 (H) 04/08/2023   Lab Results  Component Value Date   TSH 1.68 04/08/2023    PHYSICAL EXAM:  Today's Vitals   08/13/23 0841  BP: (!) 131/90  Pulse: 89  Weight: 233 lb 3.2 oz (105.8 kg)  Height: 5\' 7"  (1.702 m)   Body mass index is 36.52 kg/m.   Wt Readings from Last 3 Encounters:  08/13/23 233 lb 3.2 oz (105.8 kg)  07/03/23 244 lb (110.7 kg)  05/08/23 255 lb (115.7 kg)     Ht Readings from Last 3 Encounters:  08/13/23 5\' 7"  (1.702 m)  07/03/23 5\' 7"  (1.702 m)  05/08/23 5\' 6"  (1.676 m)      General: The patient is awake, alert and appears not in acute distress. The patient is well groomed. Head: Normocephalic, atraumatic. Neck is supple.  Mallampati 2,  neck circumference:16 inches . Nasal airflow  patent.  small oral opening. Retrognathia is not seen.  Dental status: biological, invisaline user.  Cardiovascular:  Regular rate and cardiac rhythm by pulse,  without distended neck veins. Respiratory: Lungs are clear to auscultation.  Skin:  Without evidence of ankle edema, or rash. Trunk: The patient's posture is erect.   NEUROLOGIC EXAM: The patient is awake and alert, oriented to place and time.   Memory subjective described as intact.  Attention span & concentration ability appears normal.  Speech is fluent, without dysarthria, dysphonia or aphasia.  Mood and affect are appropriate.   Cranial nerves: no loss of smell or taste reported  Pupils are equal and briskly reactive to light. Funduscopic exam deferred. Status post lasix. Marland Kitchen  Extraocular movements in vertical and horizontal planes were intact and without nystagmus. No  Diplopia. Visual fields by finger perimetry are intact. Hearing was intact to soft voice and finger rubbing.    Facial sensation intact to fine touch.  Facial motor strength is symmetric and tongue and uvula move midline.  Neck ROM : rotation, tilt and flexion extension were normal for age and shoulder shrug was symmetrical.    Motor exam:  Symmetric bulk, tone and ROM.   Normal tone without cog- wheeling, symmetric grip strength .   Sensory:  sense of fine touch, and vibration were intact  Proprioception tested in the upper extremities was normal.   Coordination: Rapid alternating movements in the fingers/hands were of normal speed.  The Finger-to-nose maneuver was intact without evidence of ataxia, dysmetria or tremor.   Gait and station: Patient could rise unassisted from a seated position, walked without assistive device.  Stance is of normal width/ base.  Toe and heel walk were deferred.  Deep tendon reflexes: in the  upper and lower extremities are symmetric and intact.     ASSESSMENT AND PLAN 47 y.o. year old  Caucasian, right -handed female patient , seen here with:    1) daytime fatigue, sleepiness but no reported snoring.   She has GERD and this condition worsened since Zebound shots started- has affected sleep somewhat.   2) at risk for OSA by BMI, neck size, small mouth.   3) fragmented sleep, 8 hours in bed, asleep for 6 hours.   HST  I plan to follow up PRN either personally or through our NP within 3-5 months.  After HST   I would like to thank Gerre Scull, NP and Gerre Scull, Np [address] [place],  [place] [address] for allowing me to meet with and to take care of this pleasant patient.    After spending a total time of  35  minutes face to face and additional time for physical and neurologic examination, review of laboratory studies,  personal review of imaging studies, reports and results of other testing and review of referral  information / records as far as provided in visit,   Electronically signed by: Melvyn Novas, MD 08/13/2023 8:59 AM  [facility] and [facility] Board certified by The [employer] of Sleep Medicine and Diplomate of the [employer] of Sleep Medicine. Board certified In Neurology through the ABPN, Fellow of the [employer] of Neurology.

## 2023-08-13 NOTE — Patient Instructions (Addendum)
ASSESSMENT AND PLAN 47 y.o. year old  Caucasian, right -handed female patient , seen here with:    1) daytime fatigue, sleepiness but no reported snoring.   She has GERD and this condition worsened since Zebound shots started- has affected sleep somewhat.   2) at risk for OSA by BMI, neck size, small mouth.   3) fragmented sleep, 8 hours in bed, asleep for 6 hours.   Order HST . Follow up depending on outcome of the test- 3-5 months. Screening for Sleep Apnea  Sleep apnea is a condition in which breathing pauses or becomes shallow during sleep. Sleep apnea screening is a test to determine if you are at risk for sleep apnea. The test includes a series of questions. It will only takes a few minutes. Your health care provider may ask you to have this test in preparation for surgery or as part of a physical exam. What are the symptoms of sleep apnea? Common symptoms of sleep apnea include: Snoring. Waking up often at night. Daytime sleepiness. Pauses in breathing. Choking or gasping during sleep. Irritability. Forgetfulness. Trouble thinking clearly. Depression. Personality changes. Most people with sleep apnea do not know that they have it. What are the advantages of sleep apnea screening? Getting screened for sleep apnea can help: Ensure your safety. It is important for your health care providers to know whether or not you have sleep apnea, especially if you are having surgery or have other [place]-term (chronic) health conditions. Improve your health and allow you to get a better night's rest. Restful sleep can help you: Have more energy. Lose weight. Improve high blood pressure. Improve diabetes management. Prevent stroke. Prevent car accidents. What happens during the screening? Screening usually includes being asked a list of questions about your sleep quality. Some questions you may be asked include: Do you snore? Is your sleep restless? Do you have daytime sleepiness? Has a  partner or spouse told you that you stop breathing during sleep? Have you had trouble concentrating or memory loss? What is your age? What is your neck circumference? To measure your neck, keep your back straight and gently wrap the tape measure around your neck. Put the tape measure at the middle of your neck, between your chin and collarbone. What is your sex assigned at birth? Do you have or are you being treated for high blood pressure? If your screening test is positive, you are at risk for the condition. Further testing may be needed to confirm a diagnosis of sleep apnea. Where to find more information You can find screening tools online or at your health care clinic. For more information about sleep apnea screening and healthy sleep, visit these websites: Centers for Disease Control and Prevention: [employer]Ex[employer]com.br American Sleep Apnea Association: www.sleepapnea.org Contact a health care provider if: You think that you may have sleep apnea. Summary Sleep apnea screening can help determine if you are at risk for sleep apnea. It is important for your health care providers to know whether or not you have sleep apnea, especially if you are having surgery or have other chronic health conditions. You may be asked to take a screening test for sleep apnea in preparation for surgery or as part of a physical exam. This information is not intended to replace advice given to you by your health care provider. Make sure you discuss any questions you have with your health care provider. Document Revised: 08/24/2020 Document Reviewed: 08/24/2020 Elsevier Patient Education  2024 [employer].

## 2023-08-16 ENCOUNTER — Encounter: Payer: Self-pay | Admitting: Nurse Practitioner

## 2023-08-17 MED ORDER — ZEPBOUND 10 MG/0.5ML ~~LOC~~ SOAJ: 10.0000 mg | SUBCUTANEOUS | 1 refills | Status: DC

## 2023-08-26 LAB — HM MAMMOGRAPHY

## 2023-09-01 ENCOUNTER — Encounter: Payer: Self-pay | Admitting: Nurse Practitioner

## 2023-09-02 ENCOUNTER — Ambulatory Visit: Payer: No Typology Code available for payment source | Admitting: Neurology

## 2023-09-02 DIAGNOSIS — E66812 Obesity, class 2: Secondary | ICD-10-CM

## 2023-09-02 DIAGNOSIS — R5383 Other fatigue: Secondary | ICD-10-CM

## 2023-09-02 DIAGNOSIS — R5382 Chronic fatigue, unspecified: Secondary | ICD-10-CM

## 2023-09-02 DIAGNOSIS — Z9189 Other specified personal risk factors, not elsewhere classified: Secondary | ICD-10-CM

## 2023-09-02 DIAGNOSIS — G4733 Obstructive sleep apnea (adult) (pediatric): Secondary | ICD-10-CM | POA: Diagnosis not present

## 2023-09-17 ENCOUNTER — Telehealth: Payer: Self-pay | Admitting: Neurology

## 2023-09-17 NOTE — Progress Notes (Signed)
[facility] 47 year old female 1975/12/09   HOME SLEEP TEST REPORT ( by [facility] PAT)   STUDY DATE:  09-18-2023   ORDERING CLINICIAN: Melvyn Novas, MD  REFERRING CLINICIAN: PCP   CLINICAL INFORMATION/HISTORY: Kimberly Valentine. [name] is a 47 y.o. female patient who is seen upon PCP/ NP referral on 08/13/2023  for an evaluation of  FATIGUE.  Chief concern according to patient :  In early October, I got lightheaded and likely dehydrated- Iv fluids were helpful, and I had a headache when I was seen in the ED in [place]-  I also got nauseated, with my headaches- and steroids resolved the headaches. "    I have the pleasure of seeing Kimberly Valentine on 08/13/23 for a possible sleep disorder. She has had no previous sleep study.  She reports feeling fatigued, drowsy and having morning headaches-  sleep being not refreshing. No snoring. GERD.  She lost 45 pounds over a period of 6 months on Zebound. (!) Sleep relevant medical history: Nocturia 1-2 times, wisdom teeth extraction. Allergies, sinus congestion, migraine for 15-20 years, infrequent.  Family medical /sleep history: Sister and daughter on CPAP with OSA.     Epworth sleepiness score: 2/ 24 points / FSS endorsed at 36/ 63 points.    BMI: 36.6 kg/m   Neck Circumference: 16'   FINDINGS:   Sleep Summary:   Total Recording Time (hours, min): 9 hours 11 minutes      Total Sleep Time (hours, min):   7 hours 56 minutes              Percent REM (%): 20%                                      Respiratory Indices, following AASM criteria:   Calculated pAHI (per hour):   21.4/h                          REM pAHI: 37.5/h                                                 NREM pAHI:    17.3/h                          Positional AHI:     The patient slept mostly in supine, associated with an AHI of 23.5/h, nonsupine sleep was associated with an AHI of 16.7/h and lower on the right lateral sleep position with  16.1/h.  Snoring reached a mean volume of 44 dB which is considered loud., and was present for 40% of the total sleep time.                                              Oxygen Saturation Statistics:   Oxygen Saturation (%) Mean:   94%                 O2 Saturation Range (%):     Between 81% at nadir and the maximum of 100%  O2 Saturation (minutes) <89%:    1.2 minutes       Pulse Rate Statistics:   Pulse Mean (bpm): 74 bpm               Pulse Range:   Between 61 bpm and the maximum at 93 bpm              IMPRESSION:  This HST confirms the presence of a moderate degree of all obstructive sleep apnea without any central apneas being present.  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  REM sleep dependent apnea is often associated with obesity and weight loss will help this.  I appreciate that the patient is in the process of losing weight and rather rapidly but this degree of apnea should still be treated until she has reached a healthy body mass index.  I would strongly recommend to start with auto titration CPAP.    RECOMMENDATION:  I will offer her an auto titration CPAP device between 6 and 16 cm water pressure with 2 cm expiratory pressure relief and heated humidification. During my exam I noted a very small oral opening and will ask the durable medical equipment company to assess her for the best fitting interface, if possible for a nasal or under the nose CPAP device associated with a chinstrap.      INTERPRETING PHYSICIAN:   Melvyn Novas, MD  [facility] and [facility] Sleep Board certified by The [employer] of Sleep Medicine and Diplomate of the [employer] of Sleep Medicine. Board certified In Neurology through the ABPN, Fellow of the [employer] of Neurology.

## 2023-09-17 NOTE — Telephone Encounter (Signed)
Sent mychart

## 2023-09-18 DIAGNOSIS — G4733 Obstructive sleep apnea (adult) (pediatric): Secondary | ICD-10-CM | POA: Insufficient documentation

## 2023-09-18 NOTE — Procedures (Signed)
[facility] 47 year old female 17-Nov-1975   HOME SLEEP TEST REPORT ( by Watch PAT)   STUDY DATE:  09-18-2023   ORDERING CLINICIAN: Melvyn Novas, MD  REFERRING CLINICIAN: PCP   CLINICAL INFORMATION/HISTORY: Kimberly Valentine is a 47 y.o. female patient who is seen upon PCP/ NP referral on 08/13/2023  for an evaluation of  FATIGUE.  Chief concern according to patient :  In early October, I got lightheaded and likely dehydrated- Iv fluids were helpful, and I had a headache when I was seen in the ED in [place]-  I also got nauseated, with my headaches- and steroids resolved the headaches. "    I have the pleasure of seeing Kimberly Valentine on 08/13/23 for a possible sleep disorder. She has had no previous sleep study.  She reports feeling fatigued, drowsy and having morning headaches-  sleep being not refreshing. No reported snoring. GERD.  She is still obese but  lost 45 pounds over a period of 6 months on Zebound. (!) Sleep relevant medical history: Nocturia 1-2 times, wisdom teeth extraction. Allergies, sinus congestion, migraine for 15-20 years, infrequent.  Family medical /sleep history: Sister and daughter on CPAP with OSA.     Epworth sleepiness score: 2/ 24 points / FSS endorsed at 36/ 63 points.    BMI: 36.6 kg/m   Neck Circumference: 16'   FINDINGS:   Sleep Summary:   Total Recording Time (hours, min): 9 hours 11 minutes      Total Sleep Time (hours, min):   7 hours 56 minutes              Percent REM (%): 20%                                      Respiratory Indices, following AASM criteria:   Calculated pAHI (per hour):   21.4/h                          REM pAHI: 37.5/h                                                 NREM pAHI:    17.3/h                          Positional AHI:     The patient slept mostly in supine, associated with an AHI of 23.5/h, nonsupine sleep was associated with an AHI of 16.7/h and lower on the right lateral  sleep position with 16.1/h.  Snoring reached a mean volume of 44 dB which is considered loud., and was present for 40% of the total sleep time.                                              Oxygen Saturation Statistics:   Oxygen Saturation (%) Mean:   94%                 O2 Saturation Range (%):     Between 81% at nadir and the  maximum of 100%                                  O2 Saturation (minutes) <89%:    1.2 minutes       Pulse Rate Statistics:   Pulse Mean (bpm): 74 bpm               Pulse Range:   Between 61 bpm and the maximum at 93 bpm              IMPRESSION:  This HST confirms the presence of a moderate degree of all obstructive sleep apnea without any central apneas being present.  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  REM sleep dependent apnea is often associated with obesity and weight loss will help this.  I appreciate that the patient is in the process of losing weight and rather rapidly but this degree of apnea should still be treated until she has reached a healthy body mass index.  I would strongly recommend to start with auto titration CPAP.    RECOMMENDATION:  I will offer her an auto titration CPAP device between 6 and 16 cm water pressure with 2 cm expiratory pressure relief and heated humidification. During my exam I noted a very small oral opening and will ask the durable medical equipment company to assess her for the best fitting interface, if possible for a nasal or under the nose CPAP device associated with a chinstrap.      INTERPRETING PHYSICIAN:   Melvyn Novas, MD  [facility] Sleep Board certified by The [employer] and Diplomate of the [employer] of [employer]. Board certified In Neurology through the ABPN, Fellow of the [employer] of Neu[employer].

## 2023-09-22 ENCOUNTER — Telehealth: Payer: Self-pay

## 2023-09-22 NOTE — Telephone Encounter (Signed)
-----   Message from Porter Dohmeier sent at 09/18/2023  4:03 PM EST ----- See report of OSA, mild degree but REM dependent.  I already ordered an  auto titration CPAP device between 6 and 16 cm water pressure with 2 cm expiratory pressure relief and heated humidification. DME : During my exam I noted a very small oral opening and will ask the durable medical equipment company to assess her for the best fitting interface, if possible for a nasal or under- the -nose CPAP interface ,associated with a chinstrap.

## 2023-09-22 NOTE — Telephone Encounter (Signed)
I called pt. I advised pt that Dr. Vickey Huger reviewed their sleep study results and found that pt has mild OSA. Dr. Vickey Huger recommends that pt begin an auto titration CPAP. I reviewed CPAP compliance expectations with the pt. Pt is agreeable to starting a CPAP. I advised pt that an order will be sent to a DME, [employer], and [employer] will call the pt within about one week after they file with the pt's insurance. [employer] will show the pt how to use the machine, fit for masks, and troubleshoot the CPAP if needed. A follow up appt was made for insurance purposes with Butch Penny, NP on 12/11/2023. Pt verbalized understanding of results. Pt had no questions at this time but was encouraged to call back if questions arise. I have sent the order to [employer].

## 2023-09-29 ENCOUNTER — Encounter: Payer: Self-pay | Admitting: Nurse Practitioner

## 2023-10-01 ENCOUNTER — Telehealth: Payer: Self-pay

## 2023-10-01 ENCOUNTER — Other Ambulatory Visit (HOSPITAL_COMMUNITY): Payer: Self-pay

## 2023-10-01 MED ORDER — ZEPBOUND 10 MG/0.5ML ~~LOC~~ SOAJ: 10.0000 mg | SUBCUTANEOUS | 1 refills | Status: DC

## 2023-10-01 NOTE — Telephone Encounter (Signed)
 Pharmacy Patient Advocate Encounter   Received notification from Pt Calls Messages that prior authorization for Zepbound  10mg /0.71ml is required/requested.   Insurance verification completed.   The patient is insured through [facility] .   Per test claim: PA required; PA started via CoverMyMeds. KEY [id number] . Waiting for clinical questions to populate.

## 2023-10-02 NOTE — Telephone Encounter (Signed)
 Clinical questions answered and PA submitted.

## 2023-10-03 ENCOUNTER — Other Ambulatory Visit: Payer: Self-pay | Admitting: Nurse Practitioner

## 2023-10-05 ENCOUNTER — Ambulatory Visit: Payer: No Typology Code available for payment source | Admitting: Nurse Practitioner

## 2023-10-05 NOTE — Telephone Encounter (Signed)
 Pharmacy Patient Advocate Encounter  Received notification from [facility] that Prior Authorization for Zepbound  10mg /0.75ml has been DENIED.  Full denial letter will be uploaded to the media tab. See denial reason below.   PA #/Case ID/Reference #: Why your request was denied: Your plan only covers this drug when you have completed at least 3 months of therapy with the requested drug at a stable maintenance dose. We reviewed the information we had. Your request has been denied. Your prescriber can send us  any new or missing information for us  to review.

## 2023-10-06 NOTE — Telephone Encounter (Signed)
 Appeal faxed to [phone] with confirmation.

## 2023-10-06 NOTE — Telephone Encounter (Signed)
 Requesting: BUPROPION HCL XL 300 MG TABLET,  OLMESARTAN MEDOXOMIL 40 MG TAB  Last Visit: 07/03/2023 Next Visit: 10/15/2023 Last Refill: 04/08/2023  Please Advise

## 2023-10-07 NOTE — Telephone Encounter (Signed)
 I called and spoke with patient and notified her that prior auth for Zepbound was approved.

## 2023-10-15 ENCOUNTER — Encounter: Payer: Self-pay | Admitting: Nurse Practitioner

## 2023-10-15 ENCOUNTER — Ambulatory Visit: Payer: No Typology Code available for payment source | Admitting: Nurse Practitioner

## 2023-10-15 VITALS — BP 110/78 | HR 88 | Temp 97.0°F | Ht 67.0 in | Wt 218.6 lb

## 2023-10-15 DIAGNOSIS — H9313 Tinnitus, bilateral: Secondary | ICD-10-CM | POA: Diagnosis not present

## 2023-10-15 DIAGNOSIS — E669 Obesity, unspecified: Secondary | ICD-10-CM | POA: Diagnosis not present

## 2023-10-15 DIAGNOSIS — I10 Essential (primary) hypertension: Secondary | ICD-10-CM

## 2023-10-15 DIAGNOSIS — F5101 Primary insomnia: Secondary | ICD-10-CM

## 2023-10-15 DIAGNOSIS — R42 Dizziness and giddiness: Secondary | ICD-10-CM | POA: Insufficient documentation

## 2023-10-15 DIAGNOSIS — B372 Candidiasis of skin and nail: Secondary | ICD-10-CM

## 2023-10-15 LAB — COMPREHENSIVE METABOLIC PANEL
ALT: 13 U/L (ref 0–35)
AST: 12 U/L (ref 0–37)
Albumin: 4.2 g/dL (ref 3.5–5.2)
Alkaline Phosphatase: 55 U/L (ref 39–117)
BUN: 16 mg/dL (ref 6–23)
CO2: 29 meq/L (ref 19–32)
Calcium: 9 mg/dL (ref 8.4–10.5)
Chloride: 104 meq/L (ref 96–112)
Creatinine, Ser: 1.11 mg/dL (ref 0.40–1.20)
GFR: 58.3 mL/min — ABNORMAL LOW (ref >60.00)
Glucose, Bld: 85 mg/dL (ref 70–99)
Potassium: 4.2 meq/L (ref 3.5–5.1)
Sodium: 140 meq/L (ref 135–145)
Total Bilirubin: 0.5 mg/dL (ref 0.2–1.2)
Total Protein: 6.4 g/dL (ref 6.0–8.3)

## 2023-10-15 LAB — CBC WITH DIFFERENTIAL/PLATELET
Basophils Absolute: 0.1 10*3/uL (ref 0.0–0.1)
Basophils Relative: 1.2 % (ref 0.0–3.0)
Eosinophils Absolute: 0.1 10*3/uL (ref 0.0–0.7)
Eosinophils Relative: 3.4 % (ref 0.0–5.0)
HCT: 40.6 % (ref 36.0–46.0)
Hemoglobin: 13.7 g/dL (ref 12.0–15.0)
Lymphocytes Relative: 30.4 % (ref 12.0–46.0)
Lymphs Abs: 1.3 10*3/uL (ref 0.7–4.0)
MCHC: 33.6 g/dL (ref 30.0–36.0)
MCV: 88.1 fL (ref 78.0–100.0)
Monocytes Absolute: 0.3 10*3/uL (ref 0.1–1.0)
Monocytes Relative: 6.7 % (ref 3.0–12.0)
Neutro Abs: 2.5 10*3/uL (ref 1.4–7.7)
Neutrophils Relative %: 58.3 % (ref 43.0–77.0)
Platelets: 209 10*3/uL (ref 150.0–400.0)
RBC: 4.61 Mil/uL (ref 3.87–5.11)
RDW: 13 % (ref 11.5–15.5)
WBC: 4.4 10*3/uL (ref 4.0–10.5)

## 2023-10-15 LAB — LIPID PANEL
Cholesterol: 177 mg/dL (ref 0–200)
HDL: 49.1 mg/dL (ref >39.00)
LDL Cholesterol: 105 mg/dL — ABNORMAL HIGH (ref 0–99)
NonHDL: 127.97
Total CHOL/HDL Ratio: 4
Triglycerides: 117 mg/dL (ref 0.0–149.0)
VLDL: 23.4 mg/dL (ref 0.0–40.0)

## 2023-10-15 MED ORDER — DOXEPIN HCL 6 MG PO TABS: 6.0000 mg | ORAL_TABLET | Freq: Every evening | ORAL | Status: DC | PRN

## 2023-10-15 MED ORDER — OLMESARTAN MEDOXOMIL 20 MG PO TABS: 20.0000 mg | ORAL_TABLET | Freq: Every day | ORAL | Status: DC

## 2023-10-15 MED ORDER — TIRZEPATIDE-WEIGHT MANAGEMENT 12.5 MG/0.5ML ~~LOC~~ SOAJ: 12.5000 mg | SUBCUTANEOUS | 0 refills | Status: DC

## 2023-10-15 MED ORDER — KETOCONAZOLE 2 % EX CREA: 1.0000 | TOPICAL_CREAM | Freq: Every day | CUTANEOUS | 0 refills | Status: DC

## 2023-10-15 NOTE — Assessment & Plan Note (Signed)
Random episodes of roaring noise in the ears may be related to fluid in the ears. Use Flonase daily for two weeks and continue Zyrtec as currently prescribed.

## 2023-10-15 NOTE — Assessment & Plan Note (Signed)
Significant weight loss has been achieved through dietary changes and Zepbound, with no reported side effects. Increase Zepbound to 12.5mg  weekly. Continue dietary changes and consider incorporating exercise.

## 2023-10-15 NOTE — Progress Notes (Signed)
Established Patient Office Visit  Subjective   Patient ID: Kimberly Valentine, female    DOB: 04/03/1976  Age: 48 y.o. MRN: [id number]  Chief Complaint  Patient presents with   Weight Management    Follow up, rash under left breast    HPI  Discussed the use of AI scribe software for clinical note transcription with the patient, who gave verbal consent to proceed.  History of Present Illness   Kanaria, a patient with a history of obesity, presents for a follow-up visit after achieving significant weight loss of fifteen pounds. She attributes the weight loss to dietary changes and the use of Zepbound, with no reported side effects. She expresses a desire to increase the dosage of Zepbound to further aid in weight loss.  She also reports ongoing insomnia, despite being on doxepin. She recently started using a sleep apnea machine but is still adjusting to it. She also reports a recurring rash under the breasts, which she has been managing with Gold Bond powder. The rash comes and goes, often appearing a few days after the previous rash has cleared.  She has also experienced episodes of lightheadedness, particularly when bending over. She suspects this may be due to a drop in blood pressure, possibly related to the recent weight loss. She is currently on olmesartan 40mg  daily for blood pressure management.  Lastly, she reports a random roaring noise in the ears, described as a "whooshing" sound. She uses earbuds frequently to listen to audiobooks but denies listening at high volumes. She denies trouble hearing.       ROS See pertinent positives and negatives per HPI.    Objective:     BP 110/78 (BP Location: Left Arm, Patient Position: Sitting, Cuff Size: Normal)   Pulse 88   Temp (!) 97 F (36.1 C)   Ht 5\' 7"  (1.702 m)   Wt 218 lb 9.6 oz (99.2 kg)   SpO2 97%   BMI 34.24 kg/m  BP Readings from Last 3 Encounters:  10/15/23 110/78  08/13/23 (!) 131/90  07/03/23 120/84   Wt Readings  from Last 3 Encounters:  10/15/23 218 lb 9.6 oz (99.2 kg)  08/13/23 233 lb 3.2 oz (105.8 kg)  07/03/23 244 lb (110.7 kg)      Physical Exam Vitals and nursing note reviewed.  Constitutional:      General: She is not in acute distress.    Appearance: Normal appearance.  HENT:     Head: Normocephalic.     Right Ear: Ear canal and external ear normal. A middle ear effusion is present.     Left Ear: Ear canal and external ear normal. A middle ear effusion is present.  Eyes:     Conjunctiva/sclera: Conjunctivae normal.  Cardiovascular:     Rate and Rhythm: Normal rate and regular rhythm.     Pulses: Normal pulses.     Heart sounds: Normal heart sounds.  Pulmonary:     Effort: Pulmonary effort is normal.     Breath sounds: Normal breath sounds.  Musculoskeletal:     Cervical back: Normal range of motion and neck supple. No tenderness.  Lymphadenopathy:     Cervical: No cervical adenopathy.  Skin:    General: Skin is warm.  Neurological:     General: No focal deficit present.     Mental Status: She is alert and oriented to person, place, and time.  Psychiatric:        Mood and Affect: Mood normal.  Behavior: Behavior normal.        Thought Content: Thought content normal.        Judgment: Judgment normal.    The 10-year ASCVD risk score (Arnett DK, et al., 2019) is: 1.3%    Assessment & Plan:   Problem List Items Addressed This Visit       Cardiovascular and Mediastinum   Primary hypertension - Primary   Possible hypotension symptoms, such as dizziness, may be due to weight loss and the current olmesartan dose. Reduce olmesartan to 20mg  daily (half tablet) and monitor blood pressure at home. Check CMP, CBC, lipid panel today.       Relevant Medications   olmesartan (BENICAR) 20 MG tablet   Other Relevant Orders   CBC with Differential/Platelet   Comprehensive metabolic panel   Lipid panel     Other   Insomnia   Persistent insomnia continues despite  doxepin and the initiation of sleep apnea treatment. Increase doxepin to 6mg  at bedtime. Focus on sleep hygiene and keep wearing CPAP.      Obesity (BMI 30-39.9)   Significant weight loss has been achieved through dietary changes and Zepbound, with no reported side effects. Increase Zepbound to 12.5mg  weekly. Continue dietary changes and consider incorporating exercise.      Relevant Medications   tirzepatide (ZEPBOUND) 12.5 MG/0.5ML Pen   Tinnitus of both ears   Random episodes of roaring noise in the ears may be related to fluid in the ears. Use Flonase daily for two weeks and continue Zyrtec as currently prescribed.       Other Visit Diagnoses       Yeast infection of the skin       ketoconazole cream once daily and continue using Gold Bond powder daily, alternating with the cream. Continue treatment for one week after the rash resolves   Relevant Medications   ketoconazole (NIZORAL) 2 % cream      Return in about 3 months (around 01/13/2024) for CPE.    Gerre Scull, NP

## 2023-10-15 NOTE — Patient Instructions (Signed)
It was great to see you!  I have increased your zepbound to 12.5mg  weekly   Increase your doxepin to 6 mg at bedtime (2 tablets)  Decrease your olmesartan to 1/2 tablet daily (20 mg)  We are checking your labs today and will let you know the results via mychart/phone.   Start ketoconazole cream once a day under your breasts and use the gold bond powder once a day (one in morning one at night) keep using for 1 week after rash goes away.   You can use flonase daily for 2 weeks and see if this helps with your ears   Let's follow-up in 3 months, sooner if you have concerns.  If a referral was placed today, you will be contacted for an appointment. Please note that routine referrals can sometimes take up to 3-4 weeks to process. Please call our office if you haven't heard anything after this time frame.  Take care,  Rodman Pickle, NP\

## 2023-10-15 NOTE — Assessment & Plan Note (Signed)
Possible hypotension symptoms, such as dizziness, may be due to weight loss and the current olmesartan dose. Reduce olmesartan to 20mg  daily (half tablet) and monitor blood pressure at home. Check CMP, CBC, lipid panel today.

## 2023-10-15 NOTE — Assessment & Plan Note (Signed)
Persistent insomnia continues despite doxepin and the initiation of sleep apnea treatment. Increase doxepin to 6mg  at bedtime. Focus on sleep hygiene and keep wearing CPAP.

## 2023-11-10 ENCOUNTER — Other Ambulatory Visit: Payer: Self-pay | Admitting: Nurse Practitioner

## 2023-11-10 NOTE — Telephone Encounter (Signed)
Requesting: MONTELUKAST SOD 10 MG TABLET  Last Visit: 10/15/2023 Next Visit: 01/07/2024 Last Refill: 04/08/2023  Please Advise

## 2023-11-28 ENCOUNTER — Other Ambulatory Visit: Payer: Self-pay | Admitting: Nurse Practitioner

## 2023-12-05 ENCOUNTER — Other Ambulatory Visit: Payer: Self-pay | Admitting: Nurse Practitioner

## 2023-12-06 ENCOUNTER — Encounter: Payer: Self-pay | Admitting: Nurse Practitioner

## 2023-12-07 MED ORDER — DOXEPIN HCL 6 MG PO TABS: 6.0000 mg | ORAL_TABLET | Freq: Every evening | ORAL | 3 refills | Status: AC | PRN

## 2023-12-07 NOTE — Progress Notes (Unsigned)
 Setup date: 10/08/23

## 2023-12-07 NOTE — Telephone Encounter (Signed)
 Requesting: ESCITALOPRAM 20 MG TABLET  Last Visit: 10/15/2023 Next Visit: 01/07/2024 Last Refill: 06/08/2023  Please Advise

## 2023-12-08 ENCOUNTER — Telehealth (INDEPENDENT_AMBULATORY_CARE_PROVIDER_SITE_OTHER): Admitting: Adult Health

## 2023-12-08 DIAGNOSIS — G4733 Obstructive sleep apnea (adult) (pediatric): Secondary | ICD-10-CM

## 2023-12-08 NOTE — Progress Notes (Signed)
 PATIENT: Kimberly Valentine DOB: [birth year]  REASON FOR VISIT: follow up HISTORY FROM: patient PRIMARY NEUROLOGIST: Dr. Vickey Huger  Virtual Visit via Video Note  I connected with Kimberly Valentine on 12/08/23 at  2:15 PM EDT by a video enabled telemedicine application located remotely at [facility] and verified that I am speaking with the correct person using two identifiers who was located at their own home.   I discussed the limitations of evaluation and management by telemedicine and the availability of in person appointments. The patient expressed understanding and agreed to proceed.   PATIENT: Kimberly Valentine DOB: 06-04-1976  REASON FOR VISIT: follow up HISTORY FROM: patient  HISTORY OF PRESENT ILLNESS: Today 12/08/23:  Kimberly Valentine is a 48 y.o. female with a history of OSA on CPAP. Returns today for follow-up. Reports that she is sleeping better at night. Not getting up at night.  She does feel well rested during the day.  Her download is below     HISTORY (Copied from Dr.Dohmeier's note) Expand All Collapse All      SLEEP MED[facility] CLIN[facility]     Provider:  Melvyn Novas, MD  Primary Care Physician:  Gerre Scull, NP [address] [place] [address]      Referring Provider: Gerre Scull, Np [address] [place],  [place] [address]             Chief Complaint according to patient   Patient presents with:      New Sleep Patient (Initial Visit) Fatigue, sleepiness.               HISTORY OF PRESENT ILLNESS:  Kimberly Valentine is a 48 y.o. female patient who is seen upon PCP/ NP referral on 08/13/2023  for an evaluation of  FATIGUE.  Chief concern according to patient :  In early October, I got lightheaded and likely dehydrated- Iv fluids were helpful, and I had a headache when I was seen in the ED in [place]-  I also got nauseated, with my headaches- and steroids resolved the headaches. "    I have the pleasure of seeing  Kimberly Valentine on 08/13/23 for a possible sleep disorder. She has had no previous sleep study.   She lost 45 pounds over a period of 6 months on Zebound.    I reviewed Mrs. Hargett's medication list, also she had a CT of her head recently when she was evaluated for a headache spell that was normal, sometimes she is using Excedrin.  Intermittently seasonally she will have nasal congestion or coughing and phlegm but this is not the case today.  Her results from CBC from July 03, 2023 were normal, no anemia no large leukocytosis, electrolytes were in normal range, liver and kidney function were normal.     Sleep relevant medical history: Nocturia 1-2 times, wisdom teeth extraction. Allergies, sinus congestion, migraine for 15-20 years, infrequent.  Family medical /sleep history: Sister  and daughter on CPAP with OSA.   Social history:  Patient is working as Paramedic job, Marine scientist- daytime  and lives in a household with husband , 2 dogs and 2 [place] pigs.  Adult daughter is living in her own home, 7 grandchildren.    Tobacco use; none .  ETOH use ; 2/ year, Caffeine intake in form of Coffee( /) Soda( 32 ounces a day) Tea ( /) or energy drinks Exercise: none regular.     Sleep habits are as follows: The patient's  dinner time is between 5-6 PM. The patient goes to bed at 8.30 PM and is asleep by 9.30 PM ,continues to sleep for a total of 6  hours, wakes for 1-2 bathroom breaks.  The bedroom is cool, quiet and dark. The preferred sleep position is lateral, with the support of 2-3 pillows.  Dreams are reportedly frequent/vivid. The patient wakes up spontaneously/ at 5.40 , 5.45  AM is the usual rise time. She reports not feeling refreshed or restored in AM, with symptoms of morning headaches( dull) , and residual fatigue. She is not woken by headaches.  Naps are taken infrequently, lasting from 2 to 3 hours, more refreshing  but interfering with nocturnal sleep.       REVIEW OF SYSTEMS: Out of a complete 14  system review of symptoms, the patient complains only of the following symptoms, and all other reviewed systems are negative.  ALLERGIES: Allergies  Allergen Reactions   Septra [Sulfamethoxazole-Trimethoprim] Hives and Shortness Of Breath   Grass Pollen(K-O-R-T-Swt Vern)    Trazodone And Nefazodone Other (See Comments)    Light headed    HOME MEDICATIONS: Outpatient Medications Prior to Visit  Medication Sig Dispense Refill   Azelastine-Fluticasone 137-50 MCG/ACT SUSP Place 1 spray into the nose every 12 (twelve) hours. 23 g 1   buPROPion (WELLBUTRIN XL) 300 MG 24 hr tablet TAKE 1 TABLET BY MOUTH EVERY DAY 90 tablet 0   butalbital-acetaminophen-caffeine (FIORICET) 50-325-40 MG tablet Take 1 tablet by mouth every 4 (four) hours as needed. (Patient not taking: Reported on 10/15/2023)     cetirizine (ZYRTEC) 10 MG tablet Take by mouth.     Cyanocobalamin (VITAMIN B12 PO) Take by mouth. (Patient not taking: Reported on 10/15/2023)     Doxepin HCl 6 MG TABS Take 1 tablet (6 mg total) by mouth at bedtime as needed (sleep). 90 tablet 3   EPINEPHrine (EPIPEN 2-PAK) 0.3 mg/0.3 mL IJ SOAJ injection Inject 0.3 mg into the muscle as needed for anaphylaxis. Bring to your allergy injection appointments (Patient not taking: Reported on 10/15/2023) 2 each 2   escitalopram (LEXAPRO) 20 MG tablet TAKE 1 AND 1/2 TABLETS DAILY BY MOUTH 135 tablet 1   fexofenadine (ALLEGRA) 180 MG tablet Take 1 tablet every day by oral route.     fluticasone (FLONASE) 50 MCG/ACT nasal spray Place 1 spray into both nostrils as needed.     ibuprofen (ADVIL) 600 MG tablet      ipratropium (ATROVENT) 0.03 % nasal spray Place 2 sprays into both nostrils 3 (three) times daily as needed for rhinitis. 30 mL 12   ketoconazole (NIZORAL) 2 % cream Apply 1 Application topically daily. 15 g 0   levocetirizine (XYZAL) 5 MG tablet      montelukast (SINGULAIR) 10 MG tablet TAKE 1 TABLET BY MOUTH EVERY DAY 90 tablet 1   olmesartan (BENICAR) 20  MG tablet Take 1 tablet (20 mg total) by mouth daily.     omeprazole (PRILOSEC) 20 MG capsule Take 1 capsule (20 mg total) by mouth daily. 90 capsule 3   tirzepatide (ZEPBOUND) 12.5 MG/0.5ML Pen INJECT 12.5 MG SUBCUTANEOUSLY ONE TIME PER WEEK 2 mL 0   VITAMIN D PO Take by mouth.     No facility-administered medications prior to visit.    PAST MEDICAL HISTORY: Past Medical History:  Diagnosis Date   Allergy    Depression    Eczema    hands and feet   Headache    Hypertension    Recurrent  upper respiratory infection (URI)     PAST SURGICAL HISTORY: Past Surgical History:  Procedure Laterality Date   ABDOMINAL HYSTERECTOMY     CHOLECYSTECTOMY     EYE SURGERY  01/02/22   Lasik   FRACTURE SURGERY  12/19/21   Broke Rt Ankle   GALLBLADDER SURGERY      FAMILY HISTORY: Family History  Problem Relation Age of Onset   Alcohol abuse Mother    Depression Mother    Diabetes Mother    Hypertension Mother    Varicose Veins Mother    Arthritis Father    Cancer Father        prostate   COPD Father    Depression Father    Hearing loss Father    Hypertension Father    Miscarriages / Stillbirths Sister    Varicose Veins Sister    Allergic rhinitis Sister    Lupus Sister    Cancer Paternal Grandmother        colon   Cancer Paternal Grandfather        lung   COPD Paternal Grandfather    Depression Daughter    Diabetes Daughter    Obesity Daughter     SOCIAL HISTORY: Social History   Socioeconomic History   Marital status: Married    Spouse name: Not on file   Number of children: Not on file   Years of education: Not on file   Highest education level: Some college, no degree  Occupational History   Not on file  Tobacco Use   Smoking status: Never    Passive exposure: Never   Smokeless tobacco: Never  Vaping Use   Vaping status: Never Used  Substance and Sexual Activity   Alcohol use: Not Currently   Drug use: No   Sexual activity: Yes    Birth  control/protection: Surgical  Other Topics Concern   Not on file  Social History Narrative   Not on file   Social Drivers of Health   Financial Resource Strain: Low Risk  (10/11/2023)   Overall Financial Resource Strain (CARDIA)    Difficulty of Paying Living Expenses: Not very hard  Food Insecurity: No Food Insecurity (10/11/2023)   Hunger Vital Sign    Worried About Running Out of Food in the Last Year: Never true    Ran Out of Food in the Last Year: Never true  Transportation Needs: No Transportation Needs (10/11/2023)   PRAPARE - Administrator, Civil Service (Medical): No    Lack of Transportation (Non-Medical): No  Physical Activity: Inactive (10/11/2023)   Exercise Vital Sign    Days of Exercise per Week: 0 days    Minutes of Exercise per Session: 10 min  Stress: Stress Concern Present (10/11/2023)   [employer] of Occupational Health - Occupational Stress Questionnaire    Feeling of Stress : To some extent  Social Connections: Moderately Isolated (10/11/2023)   Social Connection and Isolation Panel [NHANES]    Frequency of Communication with Friends and Family: More than three times a week    Frequency of Social Gatherings with Friends and Family: Once a week    Attends Religious Services: Never    Database administrator or Organizations: No    Attends Banker Meetings: Not on file    Marital Status: Married  Intimate Partner Violence: Not At Risk (07/01/2023)   Received from [facility]   HITS    Over the last 12 months how often did your  partner physically hurt you?: Never    Over the last 12 months how often did your partner insult you or talk down to you?: Never    Over the last 12 months how often did your partner threaten you with physical harm?: Never    Over the last 12 months how often did your partner scream or curse at you?: Never      PHYSICAL EXAM Generalized: Well developed, in no acute distress   Neurological examination   Mentation: Alert oriented to time, place, history taking. Follows all commands speech and language fluent Cranial nerve II-XII: Facial symmetry noted  DIAGNOSTIC DATA (LABS, IMAGING, TESTING) - I reviewed patient records, labs, notes, testing and imaging myself where available.  Lab Results  Component Value Date   WBC 4.4 10/15/2023   HGB 13.7 10/15/2023   HCT 40.6 10/15/2023   MCV 88.1 10/15/2023   PLT 209.0 10/15/2023      Component Value Date/Time   NA 140 10/15/2023 0828   NA 139 07/01/2023 0000   K 4.2 10/15/2023 0828   CL 104 10/15/2023 0828   CO2 29 10/15/2023 0828   GLUCOSE 85 10/15/2023 0828   BUN 16 10/15/2023 0828   BUN 12 07/01/2023 0000   CREATININE 1.11 10/15/2023 0828   CALCIUM 9.0 10/15/2023 0828   PROT 6.4 10/15/2023 0828   ALBUMIN 4.2 10/15/2023 0828   AST 12 10/15/2023 0828   ALT 13 10/15/2023 0828   ALKPHOS 55 10/15/2023 0828   BILITOT 0.5 10/15/2023 0828   GFRNONAA >60 05/08/2023 1715   Lab Results  Component Value Date   CHOL 177 10/15/2023   HDL 49.10 10/15/2023   LDLCALC 105 (H) 10/15/2023   TRIG 117.0 10/15/2023   CHOLHDL 4 10/15/2023   No results found for: "HGBA1C" Lab Results  Component Value Date   VITAMINB12 >1500 (H) 04/08/2023   Lab Results  Component Value Date   TSH 1.68 04/08/2023      ASSESSMENT AND PLAN 48 y.o. year old female  has a past medical history of Allergy, Depression, Eczema, Headache, Hypertension, and Recurrent upper respiratory infection (URI). here with:  OSA on CPAP  CPAP compliance excellent Residual AHI is good Encouraged patient to continue using CPAP nightly and > 4 hours each night F/U in 1 year or sooner if needed    Butch Penny, MSN, NP-C 12/08/2023, 2:04 PM [facility] [address] [place], [place] [address] [phone]

## 2023-12-11 ENCOUNTER — Ambulatory Visit: Payer: No Typology Code available for payment source | Admitting: Adult Health

## 2023-12-30 ENCOUNTER — Other Ambulatory Visit: Payer: Self-pay | Admitting: Nurse Practitioner

## 2023-12-31 NOTE — Telephone Encounter (Signed)
 Requesting: ZEPBOUND 12.5 MG/0.5 ML PEN  Last Visit: 10/15/2023 Next Visit: 01/07/2024 Last Refill: 11/30/2023  Please Advise   Disp: Not specified (Pharmacy requested: 2 each)

## 2024-01-01 ENCOUNTER — Other Ambulatory Visit: Payer: Self-pay | Admitting: Nurse Practitioner

## 2024-01-01 NOTE — Telephone Encounter (Signed)
 Requesting: BUPROPION HCL XL 300 MG TABLET  Last Visit: 10/15/2023 Next Visit: 01/07/2024 Last Refill: 10/06/2023  Please Advise

## 2024-01-07 ENCOUNTER — Ambulatory Visit (INDEPENDENT_AMBULATORY_CARE_PROVIDER_SITE_OTHER): Payer: No Typology Code available for payment source | Admitting: Nurse Practitioner

## 2024-01-07 ENCOUNTER — Encounter: Payer: Self-pay | Admitting: Nurse Practitioner

## 2024-01-07 VITALS — BP 108/80 | HR 80 | Temp 97.8°F | Ht 67.0 in | Wt 204.2 lb

## 2024-01-07 DIAGNOSIS — E782 Mixed hyperlipidemia: Secondary | ICD-10-CM

## 2024-01-07 DIAGNOSIS — Z0000 Encounter for general adult medical examination without abnormal findings: Secondary | ICD-10-CM | POA: Diagnosis not present

## 2024-01-07 DIAGNOSIS — E669 Obesity, unspecified: Secondary | ICD-10-CM

## 2024-01-07 DIAGNOSIS — F419 Anxiety disorder, unspecified: Secondary | ICD-10-CM

## 2024-01-07 DIAGNOSIS — G43009 Migraine without aura, not intractable, without status migrainosus: Secondary | ICD-10-CM | POA: Diagnosis not present

## 2024-01-07 DIAGNOSIS — F32A Depression, unspecified: Secondary | ICD-10-CM

## 2024-01-07 DIAGNOSIS — I10 Essential (primary) hypertension: Secondary | ICD-10-CM

## 2024-01-07 DIAGNOSIS — F5101 Primary insomnia: Secondary | ICD-10-CM

## 2024-01-07 NOTE — Assessment & Plan Note (Signed)
 Chronic, stable. Continue focus on nutrition and exercise.

## 2024-01-07 NOTE — Assessment & Plan Note (Signed)
 Chronic, stable. BP still well controlled and with weight loss, will discontinue olmesartan. Continue checking blood pressure at home. Follow-up in 3 months.

## 2024-01-07 NOTE — Assessment & Plan Note (Signed)
 BMI 31.9. She has continued to lose weight on zepbound 12.5mg  injection weekly.

## 2024-01-07 NOTE — Assessment & Plan Note (Signed)
 Chronic, stable. Continue lexapro 30mg  daily and wellbutrin 300mg  daily.

## 2024-01-07 NOTE — Assessment & Plan Note (Signed)
Health maintenance reviewed and updated. Discussed nutrition, exercise. Follow-up 1 year.

## 2024-01-07 NOTE — Progress Notes (Signed)
 BP 108/80 (BP Location: Left Arm, Patient Position: Sitting, Cuff Size: Normal)   Pulse 80   Temp 97.8 F (36.6 C)   Ht 5\' 7"  (1.702 m)   Wt 204 lb 3.2 oz (92.6 kg)   SpO2 98%   BMI 31.98 kg/m    Subjective:    Patient ID: Kimberly Valentine, female    DOB: 15-Oct-1975, 48 y.o.   MRN: [id number]  CC: Chief Complaint  Patient presents with   Annual Exam    With fasting lab work, no concerns    HPI: Kimberly Valentine is a 48 y.o. female presenting on 01/07/2024 for comprehensive medical examination. Current medical complaints include: none  She currently lives with: husband, children  Menopausal Symptoms: no  Depression and Anxiety Screen done today and results listed below:     01/07/2024    8:46 AM 04/08/2023    8:11 AM 10/14/2022   10:05 AM 04/17/2022   10:11 AM  Depression screen PHQ 2/9  Decreased Interest 0 1 2 2   Down, Depressed, Hopeless 1 1 3  0  PHQ - 2 Score 1 2 5 2   Altered sleeping 1 1 2 3   Tired, decreased energy 1 3 3 3   Change in appetite 0 0 2 2  Feeling bad or failure about yourself  0 2 1 1   Trouble concentrating 1 1 1 1   Moving slowly or fidgety/restless 0 0 0 0  Suicidal thoughts 0 0 1 1  PHQ-9 Score 4 9 15 13   Difficult doing work/chores Not difficult at all Somewhat difficult Very difficult Somewhat difficult      01/07/2024    8:46 AM 04/08/2023    8:12 AM 10/14/2022   10:05 AM 04/17/2022   10:12 AM  GAD 7 : Generalized Anxiety Score  Nervous, Anxious, on Edge 0 0 1 1  Control/stop worrying 1 0 1 0  Worry too much - different things 0 0 1 2  Trouble relaxing 0 0 2 3  Restless 0 0 1 0  Easily annoyed or irritable 0 0 2 1  Afraid - awful might happen 1 0 0 0  Total GAD 7 Score 2 0 8 7  Anxiety Difficulty Not difficult at all  Somewhat difficult Somewhat difficult    The patient has a history of falls. I did complete a risk assessment for falls. A plan of care for falls was documented.   Past Medical History:  Past Medical History:  Diagnosis Date    Allergy    Depression    Eczema    hands and feet   Headache    Hypertension    Recurrent upper respiratory infection (URI)     Surgical History:  Past Surgical History:  Procedure Laterality Date   ABDOMINAL HYSTERECTOMY     CHOLECYSTECTOMY     EYE SURGERY  01/02/22   Lasik   FRACTURE SURGERY  12/19/21   Broke Rt Ankle   GALLBLADDER SURGERY      Medications:  Current Outpatient Medications on File Prior to Visit  Medication Sig   Azelastine-Fluticasone 137-50 MCG/ACT SUSP Place 1 spray into the nose every 12 (twelve) hours.   buPROPion (WELLBUTRIN XL) 300 MG 24 hr tablet TAKE 1 TABLET BY MOUTH EVERY DAY   cetirizine (ZYRTEC) 10 MG tablet Take by mouth.   Doxepin HCl 6 MG TABS Take 1 tablet (6 mg total) by mouth at bedtime as needed (sleep).   escitalopram (LEXAPRO) 20 MG tablet TAKE 1 AND 1/2 TABLETS DAILY  BY MOUTH   fexofenadine (ALLEGRA) 180 MG tablet Take 1 tablet every day by oral route.   fluticasone (FLONASE) 50 MCG/ACT nasal spray Place 1 spray into both nostrils as needed.   ibuprofen (ADVIL) 600 MG tablet    ipratropium (ATROVENT) 0.03 % nasal spray Place 2 sprays into both nostrils 3 (three) times daily as needed for rhinitis.   ketoconazole (NIZORAL) 2 % cream Apply 1 Application topically daily.   levocetirizine (XYZAL) 5 MG tablet    montelukast (SINGULAIR) 10 MG tablet TAKE 1 TABLET BY MOUTH EVERY DAY   omeprazole (PRILOSEC) 20 MG capsule Take 1 capsule (20 mg total) by mouth daily.   tirzepatide (ZEPBOUND) 12.5 MG/0.5ML Pen INJECT 12.5 MG SUBCUTANEOUSLY ONE TIME PER WEEK   VITAMIN D PO Take by mouth.   butalbital-acetaminophen-caffeine (FIORICET) 50-325-40 MG tablet Take 1 tablet by mouth every 4 (four) hours as needed. (Patient not taking: Reported on 01/07/2024)   Cyanocobalamin (VITAMIN B12 PO) Take by mouth. (Patient not taking: Reported on 08/13/2023)   EPINEPHrine (EPIPEN 2-PAK) 0.3 mg/0.3 mL IJ SOAJ injection Inject 0.3 mg into the muscle as needed for  anaphylaxis. Bring to your allergy injection appointments (Patient not taking: Reported on 01/07/2024)   No current facility-administered medications on file prior to visit.    Allergies:  Allergies  Allergen Reactions   Septra [Sulfamethoxazole-Trimethoprim] Hives and Shortness Of Breath   Grass Pollen(K-O-R-T-Swt Vern)    Trazodone And Nefazodone Other (See Comments)    Light headed    Social History:  Social History   Socioeconomic History   Marital status: Married    Spouse name: Not on file   Number of children: Not on file   Years of education: Not on file   Highest education level: Some college, no degree  Occupational History   Not on file  Tobacco Use   Smoking status: Never    Passive exposure: Never   Smokeless tobacco: Never  Vaping Use   Vaping status: Never Used  Substance and Sexual Activity   Alcohol use: Not Currently   Drug use: No   Sexual activity: Yes    Birth control/protection: Surgical  Other Topics Concern   Not on file  Social History Narrative   Not on file   Social Drivers of Health   Financial Resource Strain: Low Risk  (10/11/2023)   Overall Financial Resource Strain (CARDIA)    Difficulty of Paying Living Expenses: Not very hard  Food Insecurity: No Food Insecurity (10/11/2023)   Hunger Vital Sign    Worried About Running Out of Food in the Last Year: Never true    Ran Out of Food in the Last Year: Never true  Transportation Needs: No Transportation Needs (10/11/2023)   PRAPARE - Administrator, Civil Service (Medical): No    Lack of Transportation (Non-Medical): No  Physical Activity: Inactive (10/11/2023)   Exercise Vital Sign    Days of Exercise per Week: 0 days    Minutes of Exercise per Session: 10 min  Stress: Stress Concern Present (10/11/2023)   [employer] of Occupational Health - Occupational Stress Questionnaire    Feeling of Stress : To some extent  Social Connections: Moderately Isolated (10/11/2023)    Social Connection and Isolation Panel [NHANES]    Frequency of Communication with Friends and Family: More than three times a week    Frequency of Social Gatherings with Friends and Family: Once a week    Attends Religious Services: Never  Active Member of Clubs or Organizations: No    Attends Banker Meetings: Not on file    Marital Status: Married  Intimate Partner Violence: Not At Risk (07/01/2023)   Received from [facility]   HITS    Over the last 12 months how often did your partner physically hurt you?: Never    Over the last 12 months how often did your partner insult you or talk down to you?: Never    Over the last 12 months how often did your partner threaten you with physical harm?: Never    Over the last 12 months how often did your partner scream or curse at you?: Never   Social History   Tobacco Use  Smoking Status Never   Passive exposure: Never  Smokeless Tobacco Never   Social History   Substance and Sexual Activity  Alcohol Use Not Currently    Family History:  Family History  Problem Relation Age of Onset   Alcohol abuse Mother    Depression Mother    Diabetes Mother    Hypertension Mother    Varicose Veins Mother    Arthritis Father    Cancer Father        prostate   COPD Father    Depression Father    Hearing loss Father    Hypertension Father    Miscarriages / Stillbirths Sister    Varicose Veins Sister    Allergic rhinitis Sister    Lupus Sister    Cancer Paternal Grandmother        colon   Cancer Paternal Grandfather        lung   COPD Paternal Grandfather    Depression Daughter    Diabetes Daughter    Obesity Daughter     Past medical history, surgical history, medications, allergies, family history and social history reviewed with patient today and changes made to appropriate areas of the chart.   Review of Systems  Constitutional: Negative.   HENT: Negative.    Eyes: Negative.   Respiratory: Negative.     Cardiovascular: Negative.   Gastrointestinal: Negative.   Genitourinary: Negative.   Musculoskeletal: Negative.   Skin: Negative.   Neurological: Negative.   Psychiatric/Behavioral: Negative.     All other ROS negative except what is listed above and in the HPI.      Objective:    BP 108/80 (BP Location: Left Arm, Patient Position: Sitting, Cuff Size: Normal)   Pulse 80   Temp 97.8 F (36.6 C)   Ht 5\' 7"  (1.702 m)   Wt 204 lb 3.2 oz (92.6 kg)   SpO2 98%   BMI 31.98 kg/m   Wt Readings from Last 3 Encounters:  01/07/24 204 lb 3.2 oz (92.6 kg)  10/15/23 218 lb 9.6 oz (99.2 kg)  08/13/23 233 lb 3.2 oz (105.8 kg)    Physical Exam Vitals and nursing note reviewed.  Constitutional:      General: She is not in acute distress.    Appearance: Normal appearance.  HENT:     Head: Normocephalic and atraumatic.     Right Ear: Tympanic membrane, ear canal and external ear normal.     Left Ear: Tympanic membrane, ear canal and external ear normal.     Mouth/Throat:     Mouth: Mucous membranes are moist.     Pharynx: No posterior oropharyngeal erythema.  Eyes:     Conjunctiva/sclera: Conjunctivae normal.  Cardiovascular:     Rate and Rhythm: Normal rate and  regular rhythm.     Pulses: Normal pulses.     Heart sounds: Normal heart sounds.  Pulmonary:     Effort: Pulmonary effort is normal.     Breath sounds: Normal breath sounds.  Abdominal:     Palpations: Abdomen is soft.     Tenderness: There is no abdominal tenderness.  Musculoskeletal:        General: Normal range of motion.     Cervical back: Normal range of motion and neck supple.     Right lower leg: No edema.     Left lower leg: No edema.  Lymphadenopathy:     Cervical: No cervical adenopathy.  Skin:    General: Skin is warm and dry.  Neurological:     General: No focal deficit present.     Mental Status: She is alert and oriented to person, place, and time.     Cranial Nerves: No cranial nerve deficit.      Coordination: Coordination normal.     Gait: Gait normal.  Psychiatric:        Mood and Affect: Mood normal.        Behavior: Behavior normal.        Thought Content: Thought content normal.        Judgment: Judgment normal.     Results for orders placed or performed in visit on 10/15/23  CBC with Differential/Platelet   Collection Time: 10/15/23  8:28 AM  Result Value Ref Range   WBC 4.4 4.0 - 10.5 K/uL   RBC 4.61 3.87 - 5.11 Mil/uL   Hemoglobin 13.7 12.0 - 15.0 g/dL   HCT 09.8 11.9 - 14.7 %   MCV 88.1 78.0 - 100.0 fl   MCHC 33.6 30.0 - 36.0 g/dL   RDW 82.9 56.2 - 13.0 %   Platelets 209.0 150.0 - 400.0 K/uL   Neutrophils Relative % 58.3 43.0 - 77.0 %   Lymphocytes Relative 30.4 12.0 - 46.0 %   Monocytes Relative 6.7 3.0 - 12.0 %   Eosinophils Relative 3.4 0.0 - 5.0 %   Basophils Relative 1.2 0.0 - 3.0 %   Neutro Abs 2.5 1.4 - 7.7 K/uL   Lymphs Abs 1.3 0.7 - 4.0 K/uL   Monocytes Absolute 0.3 0.1 - 1.0 K/uL   Eosinophils Absolute 0.1 0.0 - 0.7 K/uL   Basophils Absolute 0.1 0.0 - 0.1 K/uL  Comprehensive metabolic panel   Collection Time: 10/15/23  8:28 AM  Result Value Ref Range   Sodium 140 135 - 145 mEq/L   Potassium 4.2 3.5 - 5.1 mEq/L   Chloride 104 96 - 112 mEq/L   CO2 29 19 - 32 mEq/L   Glucose, Bld 85 70 - 99 mg/dL   BUN 16 6 - 23 mg/dL   Creatinine, Ser 8.65 0.40 - 1.20 mg/dL   Total Bilirubin 0.5 0.2 - 1.2 mg/dL   Alkaline Phosphatase 55 39 - 117 U/L   AST 12 0 - 37 U/L   ALT 13 0 - 35 U/L   Total Protein 6.4 6.0 - 8.3 g/dL   Albumin 4.2 3.5 - 5.2 g/dL   GFR 78.46 (L) >96.29 mL/min   Calcium 9.0 8.4 - 10.5 mg/dL  Lipid panel   Collection Time: 10/15/23  8:28 AM  Result Value Ref Range   Cholesterol 177 0 - 200 mg/dL   Triglycerides 528.4 0.0 - 149.0 mg/dL   HDL 13.24 >40.10 mg/dL   VLDL 27.2 0.0 - 53.6 mg/dL   LDL Cholesterol 644 (  H) 0 - 99 mg/dL   Total CHOL/HDL Ratio 4    NonHDL 127.97       Assessment & Plan:   Problem List Items Addressed  This Visit       Cardiovascular and Mediastinum   Primary hypertension   Chronic, stable. BP still well controlled and with weight loss, will discontinue olmesartan. Continue checking blood pressure at home. Follow-up in 3 months.       Migraine without aura and without status migrainosus, not intractable   Chronic, stable. Continue fioricet as needed for migraine.         Other   Anxiety and depression   Chronic, stable. Continue lexapro 30mg  daily and wellbutrin 300mg  daily.       Mixed hyperlipidemia   Chronic, stable. Continue focus on nutrition and exercise.       Insomnia   Chronic, stable. Continue doxepin 6mg  daily at bedtime.       Obesity (BMI 30-39.9)   BMI 31.9. She has continued to lose weight on zepbound 12.5mg  injection weekly.       Routine general medical examination at a health care facility - Primary   Health maintenance reviewed and updated. Discussed nutrition, exercise. Follow-up 1 year.          Follow up plan: Return in about 3 months (around 04/07/2024) for weight management .   LABORATORY TESTING:  - Pap smear: not applicable  IMMUNIZATIONS:   - Tdap: Tetanus vaccination status reviewed: last tetanus booster within 10 years. - Influenza: Postponed to flu season - Pneumovax: Not applicable - Prevnar: Not applicable - HPV: Not applicable - Shingrix vaccine: Not applicable  SCREENING: -Mammogram: Up to date  - Colonoscopy:  has appointment next month   - Bone Density: Not applicable   PATIENT COUNSELING:   Advised to take 1 mg of folate supplement per day if capable of pregnancy.   Sexuality: Discussed sexually transmitted diseases, partner selection, use of condoms, avoidance of unintended pregnancy  and contraceptive alternatives.   Advised to avoid cigarette smoking.  I discussed with the patient that most people either abstain from alcohol or drink within safe limits (<=14/week and <=4 drinks/occasion for males, <=7/weeks and  <= 3 drinks/occasion for females) and that the risk for alcohol disorders and other health effects rises proportionally with the number of drinks per week and how often a drinker exceeds daily limits.  Discussed cessation/primary prevention of drug use and availability of treatment for abuse.   Diet: Encouraged to adjust caloric intake to maintain  or achieve ideal body weight, to reduce intake of dietary saturated fat and total fat, to limit sodium intake by avoiding high sodium foods and not adding table salt, and to maintain adequate dietary potassium and calcium preferably from fresh fruits, vegetables, and low-fat dairy products.    stressed the importance of regular exercise  Injury prevention: Discussed safety belts, safety helmets, smoke detector, smoking near bedding or upholstery.   Dental health: Discussed importance of regular tooth brushing, flossing, and dental visits.    NEXT PREVENTATIVE PHYSICAL DUE IN 1 YEAR. Return in about 3 months (around 04/07/2024) for weight management .  Nikcole Eischeid A Jaekwon Mcclune

## 2024-01-07 NOTE — Patient Instructions (Signed)
 It was great to see you!  Keep up the great work!   Stop your olmesartan   Let's follow-up in 3 months, sooner if you have concerns.  If a referral was placed today, you will be contacted for an appointment. Please note that routine referrals can sometimes take up to 3-4 weeks to process. Please call our office if you haven't heard anything after this time frame.  Take care,  Rodman Pickle, NP

## 2024-01-07 NOTE — Assessment & Plan Note (Signed)
 Chronic, stable. Continue doxepin 6mg  daily at bedtime.

## 2024-01-07 NOTE — Assessment & Plan Note (Signed)
 Chronic, stable. Continue fioricet as needed for migraine.

## 2024-01-25 ENCOUNTER — Encounter: Payer: Self-pay | Admitting: Nurse Practitioner

## 2024-01-25 ENCOUNTER — Ambulatory Visit (INDEPENDENT_AMBULATORY_CARE_PROVIDER_SITE_OTHER): Admitting: Nurse Practitioner

## 2024-01-25 VITALS — BP 110/78 | HR 86 | Temp 98.1°F | Ht 67.0 in | Wt 195.6 lb

## 2024-01-25 DIAGNOSIS — J0140 Acute pansinusitis, unspecified: Secondary | ICD-10-CM | POA: Diagnosis not present

## 2024-01-25 DIAGNOSIS — H6690 Otitis media, unspecified, unspecified ear: Secondary | ICD-10-CM | POA: Diagnosis not present

## 2024-01-25 LAB — POCT INFLUENZA A/B
Influenza A, POC: NEGATIVE
Influenza B, POC: NEGATIVE

## 2024-01-25 LAB — POC COVID19 BINAXNOW: SARS Coronavirus 2 Ag: NEGATIVE

## 2024-01-25 MED ORDER — FLUCONAZOLE 150 MG PO TABS: 150.0000 mg | ORAL_TABLET | Freq: Once | ORAL | 0 refills | Status: AC

## 2024-01-25 MED ORDER — AMOXICILLIN-POT CLAVULANATE 875-125 MG PO TABS: 1.0000 | ORAL_TABLET | Freq: Two times a day (BID) | ORAL | 0 refills | Status: DC

## 2024-01-25 MED ORDER — TRIAMCINOLONE ACETONIDE 40 MG/ML IJ SUSP
40.0000 mg | Freq: Once | INTRAMUSCULAR | Status: AC
  Administered 2024-01-25: 40 mg via INTRAMUSCULAR

## 2024-01-25 NOTE — Patient Instructions (Signed)
 It was great to see you!  Start augmentin  twice a day with food for 10 days  Keep taking tylenol  and ibuprofen as needed for fever  Drink plenty of fluids and get rest  Take diflucan after finishing antibiotics   Let's follow-up if symptoms worsen or don't improve   Take care,  Rheba Cedar, NP

## 2024-01-25 NOTE — Progress Notes (Signed)
 4  Acute Office Visit  Subjective:     Patient ID: Kimberly Valentine, female    DOB: 02-25-1976, 48 y.o.   MRN: [id number]  Chief Complaint  Patient presents with   Sinusitis    With headache and bilateral ear pain   HPI:  Discussed the use of AI scribe software for clinical note transcription with the patient, who gave verbal consent to proceed.  History of Present Illness   The patient, with a history of sinusitis, presents with worsening symptoms since Saturday. She describes sinus pressure, ear pain, and intermittent fever. She also reports upper abdominal pain and diarrhea. She denies a sore throat, cough, chest pain, or tightness. She has been taking Tylenol  Sinus and ibuprofen without significant relief. She denies recent exposure to sick contacts and has not performed a home COVID test.      ROS See pertinent positives and negatives per HPI.    Objective:    BP 110/78 (BP Location: Left Arm, Patient Position: Sitting, Cuff Size: Normal)   Pulse 86   Temp 98.1 F (36.7 C) (Oral)   Ht 5\' 7"  (1.702 m)   Wt 195 lb 9.6 oz (88.7 kg)   SpO2 98%   BMI 30.64 kg/m    Physical Exam Vitals and nursing note reviewed.  Constitutional:      General: She is not in acute distress.    Appearance: Normal appearance.  HENT:     Head: Normocephalic.     Right Ear: Ear canal and external ear normal. Tympanic membrane is erythematous.     Left Ear: Ear canal and external ear normal. A middle ear effusion is present. Tympanic membrane is erythematous.     Nose:     Right Sinus: Maxillary sinus tenderness and frontal sinus tenderness present.     Left Sinus: Maxillary sinus tenderness and frontal sinus tenderness present.     Mouth/Throat:     Mouth: Mucous membranes are moist.     Pharynx: No posterior oropharyngeal erythema.  Eyes:     Conjunctiva/sclera: Conjunctivae normal.  Cardiovascular:     Rate and Rhythm: Normal rate and regular rhythm.     Pulses: Normal pulses.     Heart  sounds: Normal heart sounds.  Pulmonary:     Effort: Pulmonary effort is normal.     Breath sounds: Normal breath sounds.  Musculoskeletal:     Cervical back: Normal range of motion and neck supple. No tenderness.  Lymphadenopathy:     Cervical: No cervical adenopathy.  Skin:    General: Skin is warm.  Neurological:     General: No focal deficit present.     Mental Status: She is alert and oriented to person, place, and time.  Psychiatric:        Mood and Affect: Mood normal.        Behavior: Behavior normal.        Thought Content: Thought content normal.        Judgment: Judgment normal.     Results for orders placed or performed in visit on 01/25/24  POCT Influenza A/B  Result Value Ref Range   Influenza A, POC Negative Negative   Influenza B, POC Negative Negative  POC COVID-19 BinaxNow  Result Value Ref Range   SARS Coronavirus 2 Ag Negative Negative        Assessment & Plan:      Acute sinusitis   She likely has acute sinusitis with fever and sinus pressure. A COVID and flu  test was negative, and over-the-counter medications have not provided significant relief. Prescribe Augmentin  twice daily with food for 10 days. Advise Tylenol  and ibuprofen as needed for fever. Encourage fluids and rest. Administer kenolog 40mg  IM today for symptom relief. Encourage rest and fluids.  Acute otitis media   She likely has acute otitis media, possibly secondary to sinusitis. Augmentin  will address both conditions.  Antibiotic-associated yeast infection prevention   Prescribe Diflucan to prevent a yeast infection due to antibiotic use. Advise taking Diflucan after finishing antibiotics. Instruct her to contact the office if symptoms worsen or do not improve.       Meds ordered this encounter  Medications   amoxicillin -clavulanate (AUGMENTIN ) 875-125 MG tablet    Sig: Take 1 tablet by mouth 2 (two) times daily.    Dispense:  20 tablet    Refill:  0   fluconazole (DIFLUCAN) 150  MG tablet    Sig: Take 1 tablet (150 mg total) by mouth once for 1 dose. Take after finishing antibiotic    Dispense:  1 tablet    Refill:  0   triamcinolone  acetonide (KENALOG -40) injection 40 mg    Return if symptoms worsen or fail to improve.  Odette Benjamin, NP

## 2024-02-20 ENCOUNTER — Other Ambulatory Visit: Payer: Self-pay | Admitting: Nurse Practitioner

## 2024-02-23 NOTE — Telephone Encounter (Signed)
 Requesting: ZEPBOUND  12.5 MG/0.5 ML PEN  Last Visit: 01/25/2024 Next Visit: 04/07/2024 Last Refill: 12/31/2023  Please Advise

## 2024-03-02 ENCOUNTER — Other Ambulatory Visit: Payer: Self-pay | Admitting: Nurse Practitioner

## 2024-03-02 ENCOUNTER — Telehealth: Payer: Self-pay

## 2024-03-02 ENCOUNTER — Encounter: Payer: Self-pay | Admitting: Nurse Practitioner

## 2024-03-02 ENCOUNTER — Telehealth (INDEPENDENT_AMBULATORY_CARE_PROVIDER_SITE_OTHER): Admitting: Nurse Practitioner

## 2024-03-02 VITALS — Ht 67.0 in | Wt 185.0 lb

## 2024-03-02 DIAGNOSIS — F419 Anxiety disorder, unspecified: Secondary | ICD-10-CM | POA: Diagnosis not present

## 2024-03-02 DIAGNOSIS — F32A Depression, unspecified: Secondary | ICD-10-CM | POA: Diagnosis not present

## 2024-03-02 MED ORDER — REXULTI 0.5 MG PO TABS: 0.5000 mg | ORAL_TABLET | Freq: Every day | ORAL | 1 refills | Status: DC

## 2024-03-02 NOTE — Patient Instructions (Signed)
 It was great to see you!  Keep taking lexapro  and wellbutrin   Start rexulti 1 tablet daily to help in combination with the other medications   Let's follow-up at your next appointment, sooner if you have concerns.  If a referral was placed today, you will be contacted for an appointment. Please note that routine referrals can sometimes take up to 3-4 weeks to process. Please call our office if you haven't heard anything after this time frame.  Take care,  Rheba Cedar, NP

## 2024-03-02 NOTE — Telephone Encounter (Signed)
 Can we get a prior auth on REXULTI 0.5 MG TABLET ?

## 2024-03-02 NOTE — Assessment & Plan Note (Signed)
 Chronic, not controlled. Her depression is worsening with increased anger and irritability, suggesting current medications may be less effective. She denies SI/HI. Adding Rexulti is recommended to improve mood and reduce symptoms. Prescribe Rexulti 0.5mg  daily and continue lexapro  20mg  daily and wellbutrin  XL 300mg  daily. Follow up on July 10th.

## 2024-03-02 NOTE — Progress Notes (Signed)
 [facility] [address] [place] [address] Dept: [phone] Dept Fax: [phone]  Virtual Video Visit  I connected with Kimberly Valentine on 03/02/24 at 11:20 AM EDT by a video enabled telemedicine application and verified that I am speaking with the correct person using two identifiers.  Location patient: Home Location provider: Clinic Persons participating in the virtual visit: Patient; Rheba Cedar, NP; Rockford Churches, CMA  I discussed the limitations of evaluation and management by telemedicine and the availability of in person appointments. The patient expressed understanding and agreed to proceed.  Chief Complaint  Patient presents with   Depression    No concerns just about the changes in her depression    SUBJECTIVE:  HPI:   Discussed the use of AI scribe software for clinical note transcription with the patient, who gave verbal consent to proceed.  History of Present Illness   Kimberly Valentine is a 48 year old female with depression who presents with worsening symptoms.  She experiences increased anger and frequent 'blow ups', significantly impacting her daily life. These symptoms have progressively worsened since her last visit. She is dissatisfied with her emotional state and attributes the exacerbation to current life stressors. She is taking Lexapro  and Wellbutrin  with no recent changes in her medication. She lacks motivation and desire to engage in activities, affecting her daily functioning. She has no trouble sleeping and no significant issues with anxiety. She is not seeing a therapist and is not interested in therapy.        03/02/2024   11:17 AM 01/07/2024    8:46 AM 04/08/2023    8:11 AM 10/14/2022   10:05 AM 04/17/2022   10:11 AM  Depression screen PHQ 2/9  Decreased Interest 3 0 1 2 2   Down, Depressed, Hopeless 2 1 1 3  0  PHQ - 2 Score 5 1 2 5 2   Altered sleeping 1 1 1 2 3   Tired, decreased  energy 2 1 3 3 3   Change in appetite 0 0 0 2 2  Feeling bad or failure about yourself  1 0 2 1 1   Trouble concentrating 1 1 1 1 1   Moving slowly or fidgety/restless 0 0 0 0 0  Suicidal thoughts 0 0 0 1 1  PHQ-9 Score 10 4 9 15 13   Difficult doing work/chores Very difficult Not difficult at all Somewhat difficult Very difficult Somewhat difficult      03/02/2024   11:20 AM 01/07/2024    8:46 AM 04/08/2023    8:12 AM 10/14/2022   10:05 AM  GAD 7 : Generalized Anxiety Score  Nervous, Anxious, on Edge 1 0 0 1  Control/stop worrying 1 1 0 1  Worry too much - different things 3 0 0 1  Trouble relaxing 1 0 0 2  Restless 0 0 0 1  Easily annoyed or irritable 1 0 0 2  Afraid - awful might happen 1 1 0 0  Total GAD 7 Score 8 2 0 8  Anxiety Difficulty Very difficult Not difficult at all  Somewhat difficult    Patient Active Problem List   Diagnosis Date Noted   Routine general medical examination at a health care facility 01/07/2024   Obesity (BMI 30-39.9) 10/15/2023   Light headed 10/15/2023   Tinnitus of both ears 10/15/2023   OSA (obstructive sleep apnea) 09/18/2023   At risk for obstructive sleep apnea 08/13/2023   Obesity, class 2 08/13/2023   BMI (body mass index),  pediatric, 5% to less than 85% for age 49/14/2024   Migraine without aura and without status migrainosus, not intractable 07/03/2023   Fatigue 04/08/2023   Dyshidrotic hand dermatitis 10/31/2022   Recurrent sinusitis 10/31/2022   Morbid obesity (HCC) 10/14/2022   Anxiety and depression 04/17/2022   Primary hypertension 04/17/2022   Mixed hyperlipidemia 04/17/2022   Vitamin D  deficiency 04/17/2022   Vitamin B12 deficiency 04/17/2022   Insomnia 04/17/2022   Perennial and seasonal allergic rhinitis 08/04/2017    Past Surgical History:  Procedure Laterality Date   ABDOMINAL HYSTERECTOMY     CHOLECYSTECTOMY     EYE SURGERY  01/02/22   Lasik   FRACTURE SURGERY  12/19/21   Broke Rt Ankle   GALLBLADDER SURGERY       Family History  Problem Relation Age of Onset   Alcohol abuse Mother    Depression Mother    Diabetes Mother    Hypertension Mother    Varicose Veins Mother    Arthritis Father    Cancer Father        prostate   COPD Father    Depression Father    Hearing loss Father    Hypertension Father    Miscarriages / Stillbirths Sister    Varicose Veins Sister    Allergic rhinitis Sister    Lupus Sister    Cancer Paternal Grandmother        colon   Cancer Paternal Grandfather        lung   COPD Paternal Grandfather    Depression Daughter    Diabetes Daughter    Obesity Daughter     Social History   Tobacco Use   Smoking status: Never    Passive exposure: Never   Smokeless tobacco: Never  Vaping Use   Vaping status: Never Used  Substance Use Topics   Alcohol use: Not Currently   Drug use: No     Current Outpatient Medications:    Azelastine -Fluticasone  137-50 MCG/ACT SUSP, Place 1 spray into the nose every 12 (twelve) hours., Disp: 23 g, Rfl: 1   Brexpiprazole (REXULTI) 0.5 MG TABS, Take 1 tablet (0.5 mg total) by mouth daily at 6 (six) AM., Disp: 30 tablet, Rfl: 1   buPROPion  (WELLBUTRIN  XL) 300 MG 24 hr tablet, TAKE 1 TABLET BY MOUTH EVERY DAY, Disp: 90 tablet, Rfl: 0   cetirizine (ZYRTEC) 10 MG tablet, Take by mouth., Disp: , Rfl:    Doxepin  HCl 6 MG TABS, Take 1 tablet (6 mg total) by mouth at bedtime as needed (sleep)., Disp: 90 tablet, Rfl: 3   escitalopram  (LEXAPRO ) 20 MG tablet, TAKE 1 AND 1/2 TABLETS DAILY BY MOUTH, Disp: 135 tablet, Rfl: 1   fexofenadine (ALLEGRA) 180 MG tablet, Take 1 tablet every day by oral route., Disp: , Rfl:    montelukast  (SINGULAIR ) 10 MG tablet, TAKE 1 TABLET BY MOUTH EVERY DAY, Disp: 90 tablet, Rfl: 1   omeprazole  (PRILOSEC) 20 MG capsule, Take 1 capsule (20 mg total) by mouth daily., Disp: 90 capsule, Rfl: 3   tirzepatide  (ZEPBOUND ) 12.5 MG/0.5ML Pen, INJECT 12.5 MG SUBCUTANEOUSLY ONE TIME PER WEEK, Disp: 2 mL, Rfl: 1    amoxicillin -clavulanate (AUGMENTIN ) 875-125 MG tablet, Take 1 tablet by mouth 2 (two) times daily. (Patient not taking: Reported on 03/02/2024), Disp: 20 tablet, Rfl: 0   butalbital-acetaminophen -caffeine (FIORICET) 50-325-40 MG tablet, Take 1 tablet by mouth every 4 (four) hours as needed. (Patient not taking: Reported on 03/02/2024), Disp: , Rfl:    Cyanocobalamin  (VITAMIN  B12 PO), Take by mouth. (Patient not taking: Reported on 08/13/2023), Disp: , Rfl:    EPINEPHrine  (EPIPEN  2-PAK) 0.3 mg/0.3 mL IJ SOAJ injection, Inject 0.3 mg into the muscle as needed for anaphylaxis. Bring to your allergy  injection appointments (Patient not taking: Reported on 03/02/2024), Disp: 2 each, Rfl: 2   fluticasone  (FLONASE) 50 MCG/ACT nasal spray, Place 1 spray into both nostrils as needed. (Patient not taking: Reported on 03/02/2024), Disp: , Rfl:    ibuprofen (ADVIL) 600 MG tablet, , Disp: , Rfl:    ipratropium (ATROVENT ) 0.03 % nasal spray, Place 2 sprays into both nostrils 3 (three) times daily as needed for rhinitis. (Patient not taking: Reported on 03/02/2024), Disp: 30 mL, Rfl: 12   ketoconazole  (NIZORAL ) 2 % cream, Apply 1 Application topically daily. (Patient not taking: Reported on 03/02/2024), Disp: 15 g, Rfl: 0   levocetirizine (XYZAL) 5 MG tablet, , Disp: , Rfl:    VITAMIN D  PO, Take by mouth. (Patient not taking: Reported on 03/02/2024), Disp: , Rfl:   Allergies  Allergen Reactions   Septra [Sulfamethoxazole-Trimethoprim] Hives and Shortness Of Breath   Grass Pollen(K-O-R-T-Swt Vern)    Trazodone  And Nefazodone Other (See Comments)    Light headed    ROS: See pertinent positives and negatives per HPI.  OBSERVATIONS/OBJECTIVE:  VITALS per patient if applicable: Today's Vitals   03/02/24 1115  Weight: 185 lb (83.9 kg)  Height: 5\' 7"  (1.702 m)   Body mass index is 28.98 kg/m.    GENERAL: Alert and oriented. Appears well and in no acute distress.  HEENT: Atraumatic. Conjunctiva clear. No obvious  abnormalities on inspection of external nose and ears.  NECK: Normal movements of the head and neck.  LUNGS: On inspection, no signs of respiratory distress. Breathing rate appears normal. No obvious gross SOB, gasping or wheezing, and no conversational dyspnea.  CV: No obvious cyanosis.  MS: Moves all visible extremities without noticeable abnormality.  PSYCH/NEURO: Pleasant and cooperative. No obvious depression or anxiety. Speech and thought processing grossly intact.  ASSESSMENT AND PLAN:  Problem List Items Addressed This Visit       Other   Anxiety and depression - Primary   Chronic, not controlled. Her depression is worsening with increased anger and irritability, suggesting current medications may be less effective. She denies SI/HI. Adding Rexulti is recommended to improve mood and reduce symptoms. Prescribe Rexulti 0.5mg  daily and continue lexapro  20mg  daily and wellbutrin  XL 300mg  daily. Follow up on July 10th.          I discussed the assessment and treatment plan with the patient. The patient was provided an opportunity to ask questions and all were answered. The patient agreed with the plan and demonstrated an understanding of the instructions.   The patient was advised to call back or seek an in-person evaluation if the symptoms worsen or if the condition fails to improve as anticipated.   Odette Benjamin, NP

## 2024-03-02 NOTE — Telephone Encounter (Signed)
 PA request has been Submitted. New Encounter has been or will be created for follow up. For additional info see Pharmacy Prior Auth telephone encounter from 06/04.

## 2024-03-02 NOTE — Telephone Encounter (Signed)
 Pharmacy Patient Advocate Encounter   Received notification from CoverMyMeds that prior authorization for Rexulti 0.5MG  tablets is required/requested.   Insurance verification completed.   The patient is insured through [facility] .   Per test claim: PA required; PA submitted to above mentioned insurance via CoverMyMeds Key/confirmation #/EOC [id number] Status is pending

## 2024-03-03 NOTE — Telephone Encounter (Signed)
 Pharmacy Patient Advocate Encounter  Received notification from [facility] that Prior Authorization for Rexulti 0.5MG  tablets  has been DENIED.  Full denial letter will be uploaded to the media tab. See denial reason below.   PA #/Case ID/Reference #: [id number]

## 2024-03-08 MED ORDER — ARIPIPRAZOLE 5 MG PO TABS: 5.0000 mg | ORAL_TABLET | Freq: Every day | ORAL | 1 refills | Status: DC

## 2024-03-08 NOTE — Telephone Encounter (Signed)
 Noted, see mychart patient message

## 2024-03-08 NOTE — Addendum Note (Signed)
 Addended by: Zyree Traynham A on: 03/08/2024 09:40 AM   Modules accepted: Orders

## 2024-03-21 ENCOUNTER — Ambulatory Visit: Admitting: Adult Health

## 2024-03-27 ENCOUNTER — Other Ambulatory Visit: Payer: Self-pay | Admitting: Nurse Practitioner

## 2024-03-30 ENCOUNTER — Other Ambulatory Visit: Payer: Self-pay | Admitting: Nurse Practitioner

## 2024-04-05 ENCOUNTER — Other Ambulatory Visit: Payer: Self-pay | Admitting: Nurse Practitioner

## 2024-04-06 ENCOUNTER — Ambulatory Visit: Admitting: Nurse Practitioner

## 2024-04-06 NOTE — Telephone Encounter (Signed)
 Requesting: OMEPRAZOLE  DR 20 MG CAPSULE  Last Visit: 01/25/2024 Next Visit: 04/08/2024 Last Refill: 04/08/2023  Please Advise

## 2024-04-07 ENCOUNTER — Telehealth: Payer: Self-pay

## 2024-04-07 ENCOUNTER — Ambulatory Visit: Admitting: Nurse Practitioner

## 2024-04-07 NOTE — Telephone Encounter (Signed)
 Requesting: Tzhncb 0.25mg   Last Visit: 01/25/2024 Next Visit: 04/08/2024 Last Refill: New Rx request   Please Advise

## 2024-04-08 ENCOUNTER — Encounter: Payer: Self-pay | Admitting: Nurse Practitioner

## 2024-04-08 ENCOUNTER — Other Ambulatory Visit (HOSPITAL_COMMUNITY): Payer: Self-pay

## 2024-04-08 ENCOUNTER — Ambulatory Visit: Admitting: Nurse Practitioner

## 2024-04-08 VITALS — BP 124/80 | HR 81 | Temp 97.0°F | Ht 67.0 in | Wt 187.2 lb

## 2024-04-08 DIAGNOSIS — L304 Erythema intertrigo: Secondary | ICD-10-CM | POA: Diagnosis not present

## 2024-04-08 DIAGNOSIS — F32A Depression, unspecified: Secondary | ICD-10-CM

## 2024-04-08 DIAGNOSIS — F419 Anxiety disorder, unspecified: Secondary | ICD-10-CM | POA: Diagnosis not present

## 2024-04-08 DIAGNOSIS — E663 Overweight: Secondary | ICD-10-CM | POA: Insufficient documentation

## 2024-04-08 DIAGNOSIS — G4733 Obstructive sleep apnea (adult) (pediatric): Secondary | ICD-10-CM

## 2024-04-08 DIAGNOSIS — Z23 Encounter for immunization: Secondary | ICD-10-CM

## 2024-04-08 MED ORDER — MICONAZOLE NITRATE 2 % EX POWD: CUTANEOUS | 0 refills | Status: AC | PRN

## 2024-04-08 MED ORDER — WEGOVY 1.7 MG/0.75ML ~~LOC~~ SOAJ: 1.7000 mg | SUBCUTANEOUS | 1 refills | Status: DC

## 2024-04-08 MED ORDER — ZEPBOUND 15 MG/0.5ML ~~LOC~~ SOAJ: 15.0000 mg | SUBCUTANEOUS | 2 refills | Status: DC

## 2024-04-08 NOTE — Assessment & Plan Note (Signed)
 She presents with crusty discharge and irritation in her umbilicus, consistent with a fungal infection. Prescribe miconazole  powder and advise keeping the umbilical area dry after showers and exercise.

## 2024-04-08 NOTE — Assessment & Plan Note (Signed)
 She was diagnosed with moderate sleep apnea. Calculated pAHI (per hour) was 21.4 events per hour. Will continue Zepbound  and increase to 15mg  to treat sleep apnea along with assistance in weight loss.

## 2024-04-08 NOTE — Assessment & Plan Note (Signed)
 BMI 29. She has progressed from obese to overweight with Zepbound . Increased appetite may be due to Abilify . Increase Zepbound  dose to 15 mg. Continue focus on nutrition and exercise. She will get 150 minutes of exercise per week and continue with tracking food and getting adequate protein. She has lost 87 pounds since starting Zepbound  in January 2024.

## 2024-04-08 NOTE — Assessment & Plan Note (Signed)
 Chronic, stable. Her condition has significantly improved with Abilify , though she experiences difficulty concentrating, possibly as a side effect. Consider reducing the Abilify  dose by half (2.5mg ) to assess its impact on concentration. Continue Wellbutrin  300mg  daily and lexapro  20mg  daily. Follow-up in 3 months.

## 2024-04-08 NOTE — Progress Notes (Signed)
 Established Patient Office Visit  Subjective   Patient ID: Kimberly Valentine, female    DOB: [birth year]  Age: 48 y.o. MRN: [id number]  Chief Complaint  Patient presents with   Weight Management    Follow up, no concerns    HPI  Discussed the use of AI scribe software for clinical note transcription with the patient, who gave verbal consent to proceed.  History of Present Illness   Kimberly Valentine is a 48 year old female with depression and sleep apnea who presents for medication management and follow-up.  She has experienced significant improvement in depressive symptoms since starting Abilify  but is having difficulty concentrating, particularly at work. She is considering adjusting her dose to help with focus. She has noticed an increase in appetite since starting Abilify , which may be affecting her weight loss efforts.  She is currently using Zepbound  at a dose of 12.5 mg with two doses remaining and is considering increasing the dose to 15 mg. She is exploring options for medication coverage related to her sleep apnea and is interested in maintaining Zepbound  due to its potential benefits.  She reports a new issue with her umbilicus, which is leaking, becoming crusty, irritated, and red. She has not yet tried any treatments for this issue.        04/08/2024    9:12 AM 03/02/2024   11:17 AM 01/07/2024    8:46 AM 04/08/2023    8:11 AM 10/14/2022   10:05 AM  Depression screen PHQ 2/9  Decreased Interest 0 3 0 1 2  Down, Depressed, Hopeless 0 2 1 1 3   PHQ - 2 Score 0 5 1 2 5   Altered sleeping 1 1 1 1 2   Tired, decreased energy 0 2 1 3 3   Change in appetite 0 0 0 0 2  Feeling bad or failure about yourself  0 1 0 2 1  Trouble concentrating 1 1 1 1 1   Moving slowly or fidgety/restless 0 0 0 0 0  Suicidal thoughts 0 0 0 0 1  PHQ-9 Score 2 10 4 9 15   Difficult doing work/chores Not difficult at all Very difficult Not difficult at all Somewhat difficult Very difficult      04/08/2024     9:12 AM 03/02/2024   11:20 AM 01/07/2024    8:46 AM 04/08/2023    8:12 AM  GAD 7 : Generalized Anxiety Score  Nervous, Anxious, on Edge 0 1 0 0  Control/stop worrying 0 1 1 0  Worry too much - different things 0 3 0 0  Trouble relaxing 1 1 0 0  Restless 1 0 0 0  Easily annoyed or irritable 0 1 0 0  Afraid - awful might happen 0 1 1 0  Total GAD 7 Score 2 8 2  0  Anxiety Difficulty Somewhat difficult Very difficult Not difficult at all     ROS See pertinent positives and negatives per HPI.    Objective:     BP 124/80 (BP Location: Left Arm, Patient Position: Sitting, Cuff Size: Normal)   Pulse 81   Temp (!) 97 F (36.1 C)   Ht 5' 7 (1.702 m)   Wt 187 lb 3.2 oz (84.9 kg)   SpO2 99%   BMI 29.32 kg/m  BP Readings from Last 3 Encounters:  04/08/24 124/80  01/25/24 110/78  01/07/24 108/80   Wt Readings from Last 3 Encounters:  04/08/24 187 lb 3.2 oz (84.9 kg)  03/02/24 185 lb (83.9 kg)  01/25/24  195 lb 9.6 oz (88.7 kg)      Physical Exam Vitals and nursing note reviewed.  Constitutional:      General: She is not in acute distress.    Appearance: Normal appearance.  HENT:     Head: Normocephalic.  Eyes:     Conjunctiva/sclera: Conjunctivae normal.  Cardiovascular:     Rate and Rhythm: Normal rate and regular rhythm.     Pulses: Normal pulses.     Heart sounds: Normal heart sounds.  Pulmonary:     Effort: Pulmonary effort is normal.     Breath sounds: Normal breath sounds.  Musculoskeletal:     Cervical back: Normal range of motion.  Skin:    General: Skin is warm.  Neurological:     General: No focal deficit present.     Mental Status: She is alert and oriented to person, place, and time.  Psychiatric:        Mood and Affect: Mood normal.        Behavior: Behavior normal.        Thought Content: Thought content normal.        Judgment: Judgment normal.     The 10-year ASCVD risk score (Arnett DK, et al., 2019) is: 1.1%    Assessment & Plan:    Problem List Items Addressed This Visit       Respiratory   OSA (obstructive sleep apnea) - Primary   She was diagnosed with moderate sleep apnea. Calculated pAHI (per hour) was 21.4 events per hour. Will continue Zepbound  and increase to 15mg  to treat sleep apnea along with assistance in weight loss.       Relevant Medications   tirzepatide  (ZEPBOUND ) 15 MG/0.5ML Pen     Musculoskeletal and Integument   Intertrigo   She presents with crusty discharge and irritation in her umbilicus, consistent with a fungal infection. Prescribe miconazole  powder and advise keeping the umbilical area dry after showers and exercise.         Other   Anxiety and depression   Chronic, stable. Her condition has significantly improved with Abilify , though she experiences difficulty concentrating, possibly as a side effect. Consider reducing the Abilify  dose by half (2.5mg ) to assess its impact on concentration. Continue Wellbutrin  300mg  daily and lexapro  20mg  daily. Follow-up in 3 months.       Overweight (BMI 25.0-29.9)   BMI 29. She has progressed from obese to overweight with Zepbound . Increased appetite may be due to Abilify . Increase Zepbound  dose to 15 mg. Continue focus on nutrition and exercise. She will get 150 minutes of exercise per week and continue with tracking food and getting adequate protein. She has lost 87 pounds since starting Zepbound  in January 2024.      Relevant Medications   tirzepatide  (ZEPBOUND ) 15 MG/0.5ML Pen   Other Visit Diagnoses       Immunization due       Shingrix  #1 given today   Relevant Orders   Varicella-zoster vaccine IM       Return in about 3 months (around 07/09/2024) for weight management .    Tinnie DELENA Harada, NP

## 2024-04-08 NOTE — Patient Instructions (Signed)
 It was great to see you!  Let's increase your zepbound  to 15mg    Let me know if you need any refills  Start miconazole  powder twice a day to your belly button, keep this area dry after showers   Let's follow-up in 3 months, sooner if you have concerns.  If a referral was placed today, you will be contacted for an appointment. Please note that routine referrals can sometimes take up to 3-4 weeks to process. Please call our office if you haven't heard anything after this time frame.  Take care,  Tinnie Harada, NP

## 2024-04-08 NOTE — Telephone Encounter (Signed)
 Patient notified in office.

## 2024-04-15 ENCOUNTER — Other Ambulatory Visit (HOSPITAL_COMMUNITY): Payer: Self-pay

## 2024-04-19 ENCOUNTER — Other Ambulatory Visit (HOSPITAL_COMMUNITY): Payer: Self-pay

## 2024-04-19 ENCOUNTER — Telehealth: Payer: Self-pay

## 2024-04-19 NOTE — Telephone Encounter (Signed)
 Pharmacy Patient Advocate Encounter  Received notification from [facility] that Prior Authorization for Zepbound  15MG /0.5ML pen-injectors  has been DENIED.  See denial reason below. No denial letter attached in CMM. Will attach denial letter to Media tab once received.   PA #/Case ID/Reference #: [id number]

## 2024-04-19 NOTE — Telephone Encounter (Signed)
 Pharmacy Patient Advocate Encounter   Received notification from CoverMyMeds that prior authorization for Zepbound  15MG /0.5ML pen-injectors  is required/requested.   Insurance verification completed.   The patient is insured through [facility] .   Per test claim: PA required; PA submitted to above mentioned insurance via CoverMyMeds Key/confirmation #/EOC [id number] Status is pending

## 2024-04-20 MED ORDER — ONDANSETRON HCL 4 MG PO TABS: 4.0000 mg | ORAL_TABLET | Freq: Three times a day (TID) | ORAL | 0 refills | Status: AC | PRN

## 2024-04-20 MED ORDER — WEGOVY 1.7 MG/0.75ML ~~LOC~~ SOAJ: 1.7000 mg | SUBCUTANEOUS | 0 refills | Status: DC

## 2024-04-20 NOTE — Addendum Note (Signed)
 Addended by: Marykathryn Carboni A on: 04/20/2024 08:58 AM   Modules accepted: Orders

## 2024-04-20 NOTE — Addendum Note (Signed)
 Addended by: Akshat Minehart A on: 04/20/2024 04:30 PM   Modules accepted: Orders

## 2024-04-20 NOTE — Telephone Encounter (Signed)
 Patient notified of below message and also wanted something for nausea sent in to pharmacy since switching to Wegovy .

## 2024-04-21 NOTE — Telephone Encounter (Signed)
Patient notified that Zofran sent to pharmacy

## 2024-05-11 ENCOUNTER — Other Ambulatory Visit: Payer: Self-pay | Admitting: Nurse Practitioner

## 2024-05-11 NOTE — Telephone Encounter (Signed)
 Requesting: MONTELUKAST  SOD 10 MG TABLET  Last Visit: 04/08/2024 Next Visit: 07/08/2024 Last Refill: 11/10/2023  Please Advise

## 2024-06-01 ENCOUNTER — Other Ambulatory Visit: Payer: Self-pay | Admitting: Nurse Practitioner

## 2024-06-01 NOTE — Telephone Encounter (Signed)
 Requesting: ESCITALOPRAM  20 MG TABLET  Last Visit: 04/08/2024 Next Visit: 07/08/2024 Last Refill: 12/07/2023  Please Advise

## 2024-06-14 ENCOUNTER — Other Ambulatory Visit (HOSPITAL_COMMUNITY): Payer: Self-pay

## 2024-06-14 ENCOUNTER — Telehealth: Payer: Self-pay | Admitting: Pharmacy Technician

## 2024-06-14 NOTE — Telephone Encounter (Signed)
 Pharmacy Patient Advocate Encounter   Received notification from CoverMyMeds that prior authorization for Wegovy  1.7MG /0.75ML auto-injectors  is required/requested.   Insurance verification completed.   The patient is insured through [facility] .   Per test claim: PA required; PA started via CoverMyMeds. KEY [id number] . Waiting for clinical questions to populate.

## 2024-06-14 NOTE — Telephone Encounter (Signed)
 Clinical questions have been answered and PA submitted. PA currently Pending. Please be advised that most companies allow up to 30 days to make a decision. We will advise when a determination has been made, or follow up in 1 week.   Please reach out to our team, Rx Prior Auth Pool, if you haven't heard back in a week.

## 2024-06-15 ENCOUNTER — Other Ambulatory Visit (HOSPITAL_COMMUNITY): Payer: Self-pay

## 2024-06-15 NOTE — Telephone Encounter (Signed)
 Pharmacy Patient Advocate Encounter  Received notification from [facility] that Prior Authorization for Wegovy  1.7MG /0.75ML auto-injectors  has been APPROVED from 06/14/24 to 06/14/25. Unable to obtain price due to refill too soon rejection, last fill date 06/14/24 next available fill date 06/22/24   PA #/Case ID/Reference #: [id number]

## 2024-06-25 ENCOUNTER — Ambulatory Visit
Admission: RE | Admit: 2024-06-25 | Discharge: 2024-06-25 | Disposition: A | Discharge status: Home / Self Care | Source: Ambulatory Visit | Attending: Family Medicine | Admitting: Family Medicine

## 2024-06-25 ENCOUNTER — Other Ambulatory Visit: Payer: Self-pay

## 2024-06-25 VITALS — BP 119/83 | HR 80 | Temp 97.7°F | Resp 16

## 2024-06-25 DIAGNOSIS — J0101 Acute recurrent maxillary sinusitis: Secondary | ICD-10-CM | POA: Diagnosis not present

## 2024-06-25 MED ORDER — AMOXICILLIN-POT CLAVULANATE 875-125 MG PO TABS: 1.0000 | ORAL_TABLET | Freq: Two times a day (BID) | ORAL | 0 refills | Status: DC

## 2024-06-25 MED ORDER — PREDNISONE 20 MG PO TABS: 40.0000 mg | ORAL_TABLET | Freq: Every day | ORAL | 0 refills | Status: AC

## 2024-06-25 NOTE — ED Triage Notes (Addendum)
 C/O sinus congestion, facial and bilat ear pressure, HA, nasal congestion onset approx 3 wks ago. Denies fevers. Has been taking OTC Tyl Sinus.

## 2024-06-25 NOTE — Discharge Instructions (Addendum)
 Start Augmentin  twice daily for 7 days.  May take prednisone  daily for 5 days.  Nasal rinses as tolerated and use your Flonase at home daily.  Lots of rest and fluids.  Please follow-up with your PCP if your symptoms do not improve.  Please go to the ER for any worsening symptoms.  Hope you feel better soon!

## 2024-06-25 NOTE — ED Provider Notes (Signed)
 [facility]    CSN: [id number] Arrival date & time: 06/25/24  1332      History   Chief Complaint Chief Complaint  Patient presents with   Ear Fullness   Facial Pain    HPI Kimberly Valentine is a 48 y.o. female  presents for evaluation of URI symptoms for 3 weeks. Patient reports associated symptoms of sinus pressure/pain with bilateral ear pressure, headache. Denies N/V/D, [place], sore throat, cough, body aches, shortness of breath. Patient does not have a hx of asthma. Patient is not an active smoker.   Reports no known sick contacts.  Pt has taken sinus medication OTC for symptoms. Pt has no other concerns at this time.    Ear Fullness    Past Medical History:  Diagnosis Date   Allergy     Depression    Eczema    hands and feet   Headache    Hypertension    Recurrent upper respiratory infection (URI)     Patient Active Problem List   Diagnosis Date Noted   Overweight (BMI 25.0-29.9) 04/08/2024   Intertrigo 04/08/2024   Routine general medical examination at a health care facility 01/07/2024   Light headed 10/15/2023   Tinnitus of both ears 10/15/2023   OSA (obstructive sleep apnea) 09/18/2023   At risk for obstructive sleep apnea 08/13/2023   BMI (body mass index), pediatric, 5% to less than 85% for age 28/14/2024   Migraine without aura and without status migrainosus, not intractable 07/03/2023   Fatigue 04/08/2023   Dyshidrotic hand dermatitis 10/31/2022   Recurrent sinusitis 10/31/2022   Anxiety and depression 04/17/2022   Primary hypertension 04/17/2022   Mixed hyperlipidemia 04/17/2022   Vitamin D  deficiency 04/17/2022   Vitamin B12 deficiency 04/17/2022   Insomnia 04/17/2022   Perennial and seasonal allergic rhinitis 08/04/2017    Past Surgical History:  Procedure Laterality Date   ABDOMINAL HYSTERECTOMY     CHOLECYSTECTOMY     EYE SURGERY  01/02/22   Lasik   FRACTURE SURGERY  12/19/21   Broke Rt Ankle   GALLBLADDER SURGERY      OB  History   No obstetric history on file.      Home Medications    Prior to Admission medications   Medication Sig Start Date End Date Taking? Authorizing Provider  amoxicillin -clavulanate (AUGMENTIN ) 875-125 MG tablet Take 1 tablet by mouth every 12 (twelve) hours. 06/25/24  Yes Navjot Loera, Jodi R, NP  Azelastine -Fluticasone  137-50 MCG/ACT SUSP Place 1 spray into the nose every 12 (twelve) hours. 10/14/22  Yes McElwee, Lauren A, NP  buPROPion  (WELLBUTRIN  XL) 300 MG 24 hr tablet TAKE 1 TABLET BY MOUTH EVERY DAY 03/28/24  Yes McElwee, Lauren A, NP  cetirizine (ZYRTEC) 10 MG tablet Take by mouth.   Yes [provider]  Doxepin  HCl 6 MG TABS Take 1 tablet (6 mg total) by mouth at bedtime as needed (sleep). 12/07/23  Yes McElwee, Lauren A, NP  escitalopram  (LEXAPRO ) 20 MG tablet TAKE 1 AND 1/2 TABLETS DAILY BY MOUTH 06/01/24  Yes McElwee, Lauren A, NP  fexofenadine (ALLEGRA) 180 MG tablet Take 1 tablet every day by oral route.   Yes [provider]  levocetirizine (XYZAL) 5 MG tablet    Yes [provider]  montelukast  (SINGULAIR ) 10 MG tablet TAKE 1 TABLET BY MOUTH EVERY DAY 05/11/24  Yes McElwee, Lauren A, NP  omeprazole  (PRILOSEC) 20 MG capsule TAKE 1 CAPSULE BY MOUTH EVERY DAY 04/06/24  Yes McElwee, Tinnie LABOR, NP  predniSONE  (DELTASONE ) 20 MG tablet Take 2 tablets (40 mg total) by mouth daily with breakfast for 5 days. 06/25/24 06/30/24 Yes Kinzey Sheriff, Jodi R, NP  Semaglutide -Weight Management (WEGOVY ) 1.7 MG/0.75ML SOAJ Inject 1.7 mg into the skin once a week. 04/20/24  Yes McElwee, Lauren A, NP  ARIPiprazole  (ABILIFY ) 5 MG tablet TAKE 1 TABLET (5 MG TOTAL) BY MOUTH DAILY. 03/30/24   McElwee, Lauren A, NP  EPINEPHrine  (EPIPEN  2-PAK) 0.3 mg/0.3 mL IJ SOAJ injection Inject 0.3 mg into the muscle as needed for anaphylaxis. Bring to your allergy  injection appointments Patient not taking: Reported on 04/08/2024 11/10/22   Marinda Rocky SAILOR, MD  ibuprofen (ADVIL) 600 MG tablet     [provider]  miconazole  (MICOTIN) 2 % powder Apply topically as needed for itching. 04/08/24   McElwee, Lauren A, NP  ondansetron  (ZOFRAN ) 4 MG tablet Take 1 tablet (4 mg total) by mouth every 8 (eight) hours as needed. 04/20/24   McElwee, Tinnie LABOR, NP    Family History Family History  Problem Relation Age of Onset   Alcohol abuse Mother    Depression Mother    Diabetes Mother    Hypertension Mother    Varicose Veins Mother    Arthritis Father    Cancer Father        prostate   COPD Father    Depression Father    Hearing loss Father    Hypertension Father    Miscarriages / [place] Sister    Varicose Veins Sister    Allergic rhinitis Sister    Lupus Sister    Cancer Paternal Grandmother        colon   Cancer Paternal Grandfather        lung   COPD Paternal Grandfather    Depression Daughter    Diabetes Daughter    Obesity Daughter     Social History Social History   Tobacco Use   Smoking status: Never    Passive exposure: Never   Smokeless tobacco: Never  Vaping Use   Vaping status: Never Used  Substance Use Topics   Alcohol use: Not Currently   Drug use: No     Allergies   Septra [sulfamethoxazole-trimethoprim], Grass pollen(k-o-r-t-swt vern), and Trazodone  and nefazodone   Review of Systems Review of Systems  HENT:  Positive for congestion, ear pain, sinus pressure and sinus pain.      Physical Exam Triage Vital Signs ED Triage Vitals  Encounter Vitals Group     BP 06/25/24 1341 119/83     Girls Systolic BP Percentile --      Girls Diastolic BP Percentile --      Boys Systolic BP Percentile --      Boys Diastolic BP Percentile --      Pulse Rate 06/25/24 1341 80     Resp 06/25/24 1341 16     Temp 06/25/24 1341 97.7 F (36.5 C)     Temp Source 06/25/24 1341 Oral     SpO2 06/25/24 1341 97 %     Weight --      Height --      Head Circumference --      Peak Flow --      Pain Score 06/25/24 1342 4     Pain Loc --      Pain Education --       Exclude from Growth Chart --    No data found.  Updated Vital Signs BP 119/83   Pulse 80  Temp 97.7 F (36.5 C) (Oral)   Resp 16   SpO2 97%   Visual Acuity Right Eye Distance:   Left Eye Distance:   Bilateral Distance:    Right Eye Near:   Left Eye Near:    Bilateral Near:     Physical Exam Vitals and nursing note reviewed.  Constitutional:      General: She is not in acute distress.    Appearance: Normal appearance. She is well-developed. She is not ill-appearing.  HENT:     Head: Normocephalic and atraumatic.     Right Ear: Tympanic membrane and ear canal normal.     Left Ear: Tympanic membrane and ear canal normal.     Nose: Congestion present.     Right Turbinates: Swollen and pale.     Left Turbinates: Swollen and pale.     Right Sinus: Maxillary sinus tenderness present. No frontal sinus tenderness.     Left Sinus: Maxillary sinus tenderness present. No frontal sinus tenderness.     Mouth/Throat:     Mouth: Mucous membranes are moist.     Pharynx: Oropharynx is clear. Uvula midline. No posterior oropharyngeal erythema.     Tonsils: No tonsillar exudate or tonsillar abscesses.  Eyes:     Conjunctiva/sclera: Conjunctivae normal.     Pupils: Pupils are equal, round, and reactive to light.  Cardiovascular:     Rate and Rhythm: Normal rate and regular rhythm.     Heart sounds: Normal heart sounds.  Pulmonary:     Effort: Pulmonary effort is normal.     Breath sounds: Normal breath sounds.  Musculoskeletal:     Cervical back: Normal range of motion and neck supple.  Lymphadenopathy:     Cervical: No cervical adenopathy.  Skin:    General: Skin is warm and dry.  Neurological:     General: No focal deficit present.     Mental Status: She is alert and oriented to person, place, and time.  Psychiatric:        Mood and Affect: Mood normal.        Behavior: Behavior normal.      UC Treatments / Results  Labs (all labs ordered are listed, but only  abnormal results are displayed) Labs Reviewed - No data to display  EKG   Radiology No results found.  Procedures Procedures (including critical care time)  Medications Ordered in UC Medications - No data to display  Initial Impression / Assessment and Plan / UC Course  I have reviewed the triage vital signs and the nursing notes.  Pertinent labs & imaging results that were available during my care of the patient were reviewed by me and considered in my medical decision making (see chart for details).     Reviewed exam and symptoms with patient.  No red flags.  Start Augmentin  for sinusitis.  Patient requested prednisone , will do daily for 5 days.  Nasal rinses as tolerated.  Patient has fluids at home that she will start using.  Discussed rest fluids and PCP follow-up if symptoms do not improve.  ER precautions reviewed Final Clinical Impressions(s) / UC Diagnoses   Final diagnoses:  Acute recurrent maxillary sinusitis     Discharge Instructions      Start Augmentin  twice daily for 7 days.  May take prednisone  daily for 5 days.  Nasal rinses as tolerated and use your Flonase at home daily.  Lots of rest and fluids.  Please follow-up with your PCP if your symptoms do  not improve.  Please go to the ER for any worsening symptoms.  Hope you feel better soon!    ED Prescriptions     Medication Sig Dispense Auth. Provider   amoxicillin -clavulanate (AUGMENTIN ) 875-125 MG tablet Take 1 tablet by mouth every 12 (twelve) hours. 14 tablet Aleaha Fickling, Jodi R, NP   predniSONE  (DELTASONE ) 20 MG tablet Take 2 tablets (40 mg total) by mouth daily with breakfast for 5 days. 10 tablet Twania Bujak, Jodi R, NP      PDMP not reviewed this encounter.   Loreda Myla SAUNDERS, NP 06/25/24 1352

## 2024-06-30 ENCOUNTER — Other Ambulatory Visit: Payer: Self-pay | Admitting: Nurse Practitioner

## 2024-06-30 NOTE — Telephone Encounter (Signed)
 Requesting: BUPROPION  HCL XL 300 MG TABLET  Last Visit: 04/08/2024 Next Visit: 07/08/2024 Last Refill: 03/28/2024  Please Advise - Done  Requesting: ARIPIPRAZOLE  5 MG TABLET  Last Visit: 04/08/2024 Next Visit: 07/08/2024 Last Refill: 03/30/2024  Please Advise

## 2024-07-04 ENCOUNTER — Other Ambulatory Visit: Payer: Self-pay | Admitting: Nurse Practitioner

## 2024-07-04 ENCOUNTER — Encounter: Payer: Self-pay | Admitting: Nurse Practitioner

## 2024-07-04 ENCOUNTER — Ambulatory Visit: Admitting: Nurse Practitioner

## 2024-07-04 VITALS — BP 118/74 | HR 93 | Temp 97.4°F | Ht 67.0 in | Wt 189.6 lb

## 2024-07-04 DIAGNOSIS — G4733 Obstructive sleep apnea (adult) (pediatric): Secondary | ICD-10-CM | POA: Diagnosis not present

## 2024-07-04 DIAGNOSIS — J0100 Acute maxillary sinusitis, unspecified: Secondary | ICD-10-CM

## 2024-07-04 DIAGNOSIS — Z23 Encounter for immunization: Secondary | ICD-10-CM | POA: Diagnosis not present

## 2024-07-04 DIAGNOSIS — E663 Overweight: Secondary | ICD-10-CM

## 2024-07-04 MED ORDER — ZEPBOUND 2.5 MG/0.5ML ~~LOC~~ SOAJ: 2.5000 mg | SUBCUTANEOUS | 0 refills | Status: DC

## 2024-07-04 MED ORDER — TRIAMCINOLONE ACETONIDE 40 MG/ML IJ SUSP
40.0000 mg | Freq: Once | INTRAMUSCULAR | Status: AC
  Administered 2024-07-04: 40 mg via INTRAMUSCULAR

## 2024-07-04 MED ORDER — AZELASTINE-FLUTICASONE 137-50 MCG/ACT NA SUSP: 1.0000 | Freq: Two times a day (BID) | NASAL | 1 refills | Status: AC

## 2024-07-04 MED ORDER — FLUCONAZOLE 150 MG PO TABS: 150.0000 mg | ORAL_TABLET | Freq: Once | ORAL | 0 refills | Status: AC

## 2024-07-04 MED ORDER — AMOXICILLIN-POT CLAVULANATE 875-125 MG PO TABS: 1.0000 | ORAL_TABLET | Freq: Two times a day (BID) | ORAL | 0 refills | Status: DC

## 2024-07-04 NOTE — Assessment & Plan Note (Signed)
 AHI 21.4 events per hour. Will attempt to get zepbound  approved and will start at 2.5mg  injection weekly.

## 2024-07-04 NOTE — Progress Notes (Signed)
 Established Patient Office Visit  Subjective   Patient ID: Kimberly Valentine, female    DOB: 02-Jun-1976  Age: 48 y.o. MRN: [id number]  Chief Complaint  Patient presents with   Weight Managment    Follow up, discuss switching medications, sinus infection    HPI Discussed the use of AI scribe software for clinical note transcription with the patient, who gave verbal consent to proceed.  History of Present Illness   Kimberly Valentine is a 48 year old female who presents with persistent sinus symptoms and medication side effects.  She experiences persistent sinus pressure radiating to her ears and jaw, with slight improvement but not resolution for the last 4 weeks. Congestion and a stuffy nose persist, and she is currently out of nasal spray. She takes Claritin as part of her allergy  medication rotation. No fever is present. She went to urgent care and was prescribed augmentin  and prednisone , which helped some, but symptoms are still ongoing.   She experiences side effects from her Wegovy , including severe stomach pain, headaches, nausea, and constipation. Symptoms improved when she paused the medication but returned upon resumption. She previously had success with Zepbound  for weight loss, but it was discontinued due to insurance issues.       ROS See pertinent positives and negatives per HPI.    Objective:     BP 118/74 (BP Location: Right Arm, Patient Position: Sitting, Cuff Size: Normal)   Pulse 93   Temp (!) 97.4 F (36.3 C)   Ht 5' 7 (1.702 m)   Wt 189 lb 9.6 oz (86 kg)   SpO2 99%   BMI 29.70 kg/m  BP Readings from Last 3 Encounters:  07/04/24 118/74  06/25/24 119/83  04/08/24 124/80   Wt Readings from Last 3 Encounters:  07/04/24 189 lb 9.6 oz (86 kg)  04/08/24 187 lb 3.2 oz (84.9 kg)  03/02/24 185 lb (83.9 kg)      Physical Exam Vitals and nursing note reviewed.  Constitutional:      General: She is not in acute distress.    Appearance: Normal appearance.  HENT:      Head: Normocephalic.     Right Ear: Tympanic membrane, ear canal and external ear normal.     Left Ear: Tympanic membrane, ear canal and external ear normal.     Nose:     Right Sinus: Maxillary sinus tenderness present. No frontal sinus tenderness.     Left Sinus: Maxillary sinus tenderness present. No frontal sinus tenderness.  Eyes:     Conjunctiva/sclera: Conjunctivae normal.  Cardiovascular:     Rate and Rhythm: Normal rate and regular rhythm.     Pulses: Normal pulses.     Heart sounds: Normal heart sounds.  Pulmonary:     Effort: Pulmonary effort is normal.     Breath sounds: Normal breath sounds.  Musculoskeletal:     Cervical back: Normal range of motion and neck supple. No tenderness.  Lymphadenopathy:     Cervical: No cervical adenopathy.  Skin:    General: Skin is warm.  Neurological:     General: No focal deficit present.     Mental Status: She is alert and oriented to person, place, and time.  Psychiatric:        Mood and Affect: Mood normal.        Behavior: Behavior normal.        Thought Content: Thought content normal.        Judgment: Judgment normal.  The 10-year ASCVD risk score (Arnett DK, et al., 2019) is: 1%    Assessment & Plan:   Problem List Items Addressed This Visit       Respiratory   OSA (obstructive sleep apnea)   AHI 21.4 events per hour. Will attempt to get zepbound  approved and will start at 2.5mg  injection weekly.       Relevant Medications   tirzepatide  (ZEPBOUND ) 2.5 MG/0.5ML Pen     Other   Overweight (BMI 25.0-29.9)   BMI 29.7. She experiences significant adverse effects from the current medication, including severe stomach pain, nausea, headaches, and constipation. Her weight remains stable, indicating a lack of efficacy. Previous success with Zepbound  is noted, but insurance issues have prevented continued use. We will switch to Zepbound  as she also has a history of OSA and this can help treat this as well.        Relevant Medications   tirzepatide  (ZEPBOUND ) 2.5 MG/0.5ML Pen   Other Visit Diagnoses       Acute non-recurrent maxillary sinusitis    -  Primary   Symptoms improved, but not fully resolved. Repeat augmentin  BID x10 days and kenolog 40mg  IM given today.   Relevant Medications   Azelastine -Fluticasone  137-50 MCG/ACT SUSP   triamcinolone  acetonide (KENALOG -40) injection 40 mg (Completed)   amoxicillin -clavulanate (AUGMENTIN ) 875-125 MG tablet   fluconazole  (DIFLUCAN ) 150 MG tablet     Immunization due       Shingrix  #2 given today.   Relevant Orders   Varicella-zoster vaccine IM (Completed)       Return in about 3 months (around 10/04/2024) for weight management .    Tinnie DELENA Harada, NP

## 2024-07-04 NOTE — Assessment & Plan Note (Signed)
 BMI 29.7. She experiences significant adverse effects from the current medication, including severe stomach pain, nausea, headaches, and constipation. Her weight remains stable, indicating a lack of efficacy. Previous success with Zepbound  is noted, but insurance issues have prevented continued use. We will switch to Zepbound  as she also has a history of OSA and this can help treat this as well.

## 2024-07-04 NOTE — Patient Instructions (Signed)
 It was great to see you!  Start augmentin  twice a day for 10 days   We will try to appeal for Zepbound   Let's follow-up in 3 months, sooner if you have concerns.  If a referral was placed today, you will be contacted for an appointment. Please note that routine referrals can sometimes take up to 3-4 weeks to process. Please call our office if you haven't heard anything after this time frame.  Take care,  Tinnie Harada, NP

## 2024-07-04 NOTE — Telephone Encounter (Signed)
 Can we get a prior auth on tirzepatide (ZEPBOUND) 2.5 MG/0.5ML Pen?

## 2024-07-06 ENCOUNTER — Other Ambulatory Visit (HOSPITAL_COMMUNITY): Payer: Self-pay

## 2024-07-06 ENCOUNTER — Telehealth: Payer: Self-pay

## 2024-07-06 NOTE — Telephone Encounter (Signed)
 P/A has been submitted and is pending   [id number]

## 2024-07-06 NOTE — Telephone Encounter (Signed)
 Pharmacy Patient Advocate Encounter   Received notification from RX Request Messages that prior authorization for Zepbound  2.5MG /0.5ML pen-injectors  is required/requested.   Insurance verification completed.   The patient is insured through [facility].   Per test claim: PA required; PA submitted to above mentioned insurance via Latent Key/confirmation #/EOC [id number] Status is pending

## 2024-07-07 ENCOUNTER — Other Ambulatory Visit (HOSPITAL_COMMUNITY): Payer: Self-pay

## 2024-07-07 NOTE — Telephone Encounter (Signed)
 Approved   Pharmacy Patient Advocate Encounter  Received notification from [facility] that Prior Authorization for Zepbound  2.5MG /0.5ML pen-injectors has been APPROVED from 10.9.26 to 6.9.26. Ran test claim, Copay is $526.42. This test claim was processed through [facility]- copay amounts may vary at other pharmacies due to pharmacy/plan contracts, or as the patient moves through the different stages of their insurance plan.   PA #/Case ID/Reference #: [id number]

## 2024-07-08 ENCOUNTER — Ambulatory Visit: Admitting: Nurse Practitioner

## 2024-07-11 MED ORDER — TIRZEPATIDE 2.5 MG/0.5ML ~~LOC~~ SOAJ: 2.5000 mg | SUBCUTANEOUS | 0 refills | Status: DC

## 2024-07-11 NOTE — Addendum Note (Signed)
 Addended by: Rykar Lebleu A on: 07/11/2024 03:53 PM   Modules accepted: Orders

## 2024-07-12 ENCOUNTER — Other Ambulatory Visit (HOSPITAL_COMMUNITY): Payer: Self-pay

## 2024-07-25 ENCOUNTER — Telehealth: Payer: Self-pay

## 2024-07-25 ENCOUNTER — Other Ambulatory Visit (HOSPITAL_COMMUNITY): Payer: Self-pay

## 2024-07-25 NOTE — Telephone Encounter (Addendum)
 Can we get a prior auth of tirzepatide  (MOUNJARO ) 2.5 MG/0.5ML Pen?

## 2024-07-28 ENCOUNTER — Other Ambulatory Visit (HOSPITAL_COMMUNITY): Payer: Self-pay

## 2024-08-04 ENCOUNTER — Other Ambulatory Visit (HOSPITAL_COMMUNITY): Payer: Self-pay

## 2024-08-04 ENCOUNTER — Telehealth: Payer: Self-pay

## 2024-08-04 NOTE — Telephone Encounter (Signed)
 Hi Brittany,                     I wanted to provide an update regarding this case. This is not a coverage issue. The patient's plan no longer covers Tirzepatide /Zepbound  as of 7/1, as stated in the attached documentation.  The patient currently has a deductible and/or coinsurance that has not yet been met, which is why there is still an out-of-pocket cost at this time for Mounjaro .  Please note that the Prior Authorization (PA) for Mounjaro  has been approved and is now active since Zepbound  is no longer covered. The authorization is valid through 03/07/2025.

## 2024-08-04 NOTE — Telephone Encounter (Signed)
   I wanted to provide an update regarding this case. This is not a coverage issue. The patient's plan no longer covers Tirzepatide /Zepbound  as of 7/1, as stated in the attached documentation.  The patient currently has a deductible and/or coinsurance that has not yet been met, which is why there is still an out-of-pocket cost at this time for Mounjaro .  Please note that the Prior Authorization (PA) for Mounjaro  has been approved and is now active since Zepbound  is no longer covered. The authorization is valid through 03/07/2025.   Please note the providers office has been made aware of the determination and the T/C below.

## 2024-08-26 ENCOUNTER — Encounter: Payer: Self-pay | Admitting: Nurse Practitioner

## 2024-08-29 MED ORDER — TIRZEPATIDE 5 MG/0.5ML ~~LOC~~ SOAJ: 5.0000 mg | SUBCUTANEOUS | 0 refills | Status: DC

## 2024-09-02 LAB — HM MAMMOGRAPHY

## 2024-09-12 ENCOUNTER — Encounter: Payer: Self-pay | Admitting: Nurse Practitioner

## 2024-09-14 ENCOUNTER — Encounter: Payer: Self-pay | Admitting: Nurse Practitioner

## 2024-09-20 ENCOUNTER — Telehealth: Payer: Self-pay

## 2024-09-20 NOTE — Telephone Encounter (Addendum)
 I tried to call patient and mailbox was full. I will try again later.  If patient calls please let her know that Mammogram was Normal.

## 2024-09-22 ENCOUNTER — Other Ambulatory Visit: Payer: Self-pay | Admitting: Nurse Practitioner

## 2024-09-23 NOTE — Telephone Encounter (Signed)
 Requesting: MOUNJARO  5 MG/0.5 ML PEN  Last Visit: 07/04/2024 Next Visit: 10/06/2024 Last Refill: 08/29/2024  Please Advise

## 2024-09-28 ENCOUNTER — Other Ambulatory Visit: Payer: Self-pay | Admitting: Nurse Practitioner

## 2024-09-28 NOTE — Telephone Encounter (Signed)
 I called patient to follow up to see if she received mammogram results and she has.

## 2024-10-06 ENCOUNTER — Ambulatory Visit: Admitting: Nurse Practitioner

## 2024-10-06 ENCOUNTER — Encounter: Payer: Self-pay | Admitting: Nurse Practitioner

## 2024-10-06 VITALS — BP 110/80 | HR 90 | Temp 97.7°F | Ht 67.0 in | Wt 189.8 lb

## 2024-10-06 DIAGNOSIS — F5101 Primary insomnia: Secondary | ICD-10-CM | POA: Diagnosis not present

## 2024-10-06 DIAGNOSIS — G4733 Obstructive sleep apnea (adult) (pediatric): Secondary | ICD-10-CM | POA: Diagnosis not present

## 2024-10-06 DIAGNOSIS — E663 Overweight: Secondary | ICD-10-CM

## 2024-10-06 DIAGNOSIS — R102 Pelvic and perineal pain unspecified side: Secondary | ICD-10-CM

## 2024-10-06 LAB — POCT URINALYSIS DIPSTICK
Bilirubin, UA: NEGATIVE
Blood, UA: NEGATIVE
Glucose, UA: NEGATIVE
Ketones, UA: NEGATIVE
Leukocytes, UA: NEGATIVE
Nitrite, UA: NEGATIVE
Protein, UA: POSITIVE — AB
Spec Grav, UA: 1.015
Urobilinogen, UA: 1 U/dL
pH, UA: 6.5

## 2024-10-06 MED ORDER — ESZOPICLONE 1 MG PO TABS: 1.0000 mg | ORAL_TABLET | Freq: Every evening | ORAL | 0 refills | Status: AC | PRN

## 2024-10-06 MED ORDER — TIRZEPATIDE 7.5 MG/0.5ML ~~LOC~~ SOAJ: 7.5000 mg | SUBCUTANEOUS | 3 refills | Status: AC

## 2024-10-06 MED ORDER — FLUCONAZOLE 150 MG PO TABS: 150.0000 mg | ORAL_TABLET | Freq: Once | ORAL | 0 refills | Status: AC

## 2024-10-06 MED ORDER — CLOBETASOL PROPIONATE 0.05 % EX CREA: 1.0000 | TOPICAL_CREAM | Freq: Two times a day (BID) | CUTANEOUS | 0 refills | Status: AC

## 2024-10-06 MED ORDER — NITROFURANTOIN MONOHYD MACRO 100 MG PO CAPS: 100.0000 mg | ORAL_CAPSULE | Freq: Two times a day (BID) | ORAL | 0 refills | Status: AC

## 2024-10-06 NOTE — Assessment & Plan Note (Signed)
 Chronic, not controlled. She experiences chronic sleep initiation and maintenance issues, worsened by hot flashes. Previous medications caused adverse effects (trazodone , ambien), and doxepin  was ineffective. Initiated Lunesta , starting with 1mg  at bedtime. Taper off doxepin  by cutting the tablet in half for one week, then every other day for one week, then stop. Advised taking Lunesta  30-45 minutes before bed and avoiding phone use in bed.

## 2024-10-06 NOTE — Patient Instructions (Signed)
 It was great to see you!  Keep drinking plenty of water  Start macrobid  1 tablet twice a day for 5 days   Take diflucan  after finishing the macrobid    We will increase mounjaro  to 7.5mg  weekly   Let's follow-up in 3 months, sooner if you have concerns.  If a referral was placed today, you will be contacted for an appointment. Please note that routine referrals can sometimes take up to 3-4 weeks to process. Please call our office if you haven't heard anything after this time frame.  Take care,  Tinnie Harada, NP

## 2024-10-06 NOTE — Assessment & Plan Note (Signed)
 Continue mounjaro  and will increase to 7.5mg  weekly.

## 2024-10-06 NOTE — Assessment & Plan Note (Signed)
 BMI 29.7. Weight is stable at 189 lbs, body fat 38.9%, muscle mass 110 lbs, visceral fat rating 9. Basal metabolic rate is 8409-8399 calories/day. Increased Mounjaro  to 7.5 mg weekly. Advised dietary intake of 1200-1300 calories per day. Encouraged increased physical activity to aid in weight management.

## 2024-10-06 NOTE — Progress Notes (Signed)
 "  Established Patient Office Visit  Subjective   Patient ID: Kimberly Valentine, female    DOB: 03-Aug-1976  Age: 49 y.o. MRN: [id number]  Chief Complaint  Patient presents with   Weight Management    Follow up, concerns with urine urgency, pelvic pressure for 2-3 weeks    HPI Discussed the use of AI scribe software for clinical note transcription with the patient, who gave verbal consent to proceed.  History of Present Illness   Lolitha Tortora is a 49 year old female who presents with sleep disturbances and pelvic pressure.  She has difficulty both falling and staying asleep, with sleep onset being the hardest. Hot flashes contribute to her sleep problems. Doxepin  at bedtime is not effective. Trazodone  previously caused intolerable vivid dreams. She is taking Wellbutrin  and Lexapro  and her mood is well controlled.  For the past 2 to 3 weeks she has had intermittent pelvic pressure, most noticeable after urination, without burning. She reports intermittent fevers since at least Sunday, with her husband noting she feels hot when she feels cold. She had lower back pain yesterday. She denies chest pain, shortness of breath, nausea, or abdominal pain. She reports hot flashes and sweating.  Her current medications include Mounjaro , which she is tolerating well. She notes that during the holidays, she did eat more than she normally would and has not been exercising.       ROS See pertinent positives and negatives per HPI.    Objective:     BP 110/80 (BP Location: Left Arm, Patient Position: Sitting, Cuff Size: Normal)   Pulse 90   Temp 97.7 F (36.5 C)   Ht 5' 7 (1.702 m)   Wt 189 lb 12.8 oz (86.1 kg)   SpO2 98%   BMI 29.73 kg/m  BP Readings from Last 3 Encounters:  10/06/24 110/80  07/04/24 118/74  06/25/24 119/83   Wt Readings from Last 3 Encounters:  10/06/24 189 lb 12.8 oz (86.1 kg)  07/04/24 189 lb 9.6 oz (86 kg)  04/08/24 187 lb 3.2 oz (84.9 kg)      Physical Exam Vitals  and nursing note reviewed.  Constitutional:      General: She is not in acute distress.    Appearance: Normal appearance.  HENT:     Head: Normocephalic.  Eyes:     Conjunctiva/sclera: Conjunctivae normal.  Cardiovascular:     Rate and Rhythm: Normal rate and regular rhythm.     Pulses: Normal pulses.     Heart sounds: Normal heart sounds.  Pulmonary:     Effort: Pulmonary effort is normal.     Breath sounds: Normal breath sounds.  Abdominal:     Palpations: Abdomen is soft.     Tenderness: There is no abdominal tenderness.  Musculoskeletal:     Cervical back: Normal range of motion.  Skin:    General: Skin is warm.  Neurological:     General: No focal deficit present.     Mental Status: She is alert and oriented to person, place, and time.  Psychiatric:        Mood and Affect: Mood normal.        Behavior: Behavior normal.        Thought Content: Thought content normal.        Judgment: Judgment normal.      Results for orders placed or performed in visit on 10/06/24  POCT urinalysis dipstick  Result Value Ref Range   Color, UA  Clarity, UA     Glucose, UA Negative Negative   Bilirubin, UA negative    Ketones, UA negative    Spec Grav, UA 1.015 1.010 - 1.025   Blood, UA negative    pH, UA 6.5 5.0 - 8.0   Protein, UA Positive (A) Negative   Urobilinogen, UA 1.0 0.2 or 1.0 E.U./dL   Nitrite, UA negative    Leukocytes, UA Negative Negative   Appearance     Odor        The 10-year ASCVD risk score (Arnett DK, et al., 2019) is: 0.9%    Assessment & Plan:   Problem List Items Addressed This Visit       Respiratory   OSA (obstructive sleep apnea)   Continue mounjaro  and will increase to 7.5mg  weekly.         Other   Insomnia - Primary   Chronic, not controlled. She experiences chronic sleep initiation and maintenance issues, worsened by hot flashes. Previous medications caused adverse effects (trazodone , ambien), and doxepin  was ineffective.  Initiated Lunesta , starting with 1mg  at bedtime. Taper off doxepin  by cutting the tablet in half for one week, then every other day for one week, then stop. Advised taking Lunesta  30-45 minutes before bed and avoiding phone use in bed.      Relevant Medications   eszopiclone  (LUNESTA ) 1 MG TABS tablet   Overweight (BMI 25.0-29.9)   BMI 29.7. Weight is stable at 189 lbs, body fat 38.9%, muscle mass 110 lbs, visceral fat rating 9. Basal metabolic rate is 8409-8399 calories/day. Increased Mounjaro  to 7.5 mg weekly. Advised dietary intake of 1200-1300 calories per day. Encouraged increased physical activity to aid in weight management.       Other Visit Diagnoses       Pelvic pressure in female       Urine shows trace protein, negative leukocytes and nitrites. Will treat with macrobid  BID x5 days with symptoms. F/U if not improving. Diflucan  x1 after abx.   Relevant Orders   POCT urinalysis dipstick (Completed)       Return in about 3 months (around 01/04/2025) for CPE.    Tinnie DELENA Harada, NP  "

## 2024-10-11 ENCOUNTER — Other Ambulatory Visit: Payer: Self-pay | Admitting: Nurse Practitioner

## 2024-10-11 NOTE — Telephone Encounter (Signed)
 Requesting: OMEPRAZOLE  DR 20 MG CAPSULE  Last Visit: 10/06/2024 Next Visit: 01/12/2025 Last Refill: 04/06/2024  Please Advise

## 2024-10-20 ENCOUNTER — Telehealth: Payer: Self-pay | Admitting: Adult Health

## 2024-10-20 NOTE — Telephone Encounter (Signed)
 had called and moved you to a Friday for your virtual visit. I misunderstood Megan when she asked me to move the appts. She would like for that day to be in person visits. Can you do an in person visit or would you like to reschedule?

## 2024-10-20 NOTE — Telephone Encounter (Signed)
 Requested pt to call me back

## 2024-11-29 ENCOUNTER — Telehealth: Admitting: Adult Health

## 2024-12-06 ENCOUNTER — Telehealth: Admitting: Adult Health

## 2024-12-16 ENCOUNTER — Telehealth: Admitting: Adult Health

## 2025-01-12 ENCOUNTER — Encounter: Admitting: Nurse Practitioner
